# Patient Record
Sex: Female | Born: 1935 | Race: White | Hispanic: No | Smoking: Never smoker
Health system: Southern US, Community
[De-identification: ages and names within clinical notes are randomized; demographics above are authoritative.]

## PROBLEM LIST (undated history)

## (undated) DIAGNOSIS — G629 Polyneuropathy, unspecified: Secondary | ICD-10-CM

## (undated) DIAGNOSIS — E538 Deficiency of other specified B group vitamins: Secondary | ICD-10-CM

## (undated) DIAGNOSIS — K219 Gastro-esophageal reflux disease without esophagitis: Secondary | ICD-10-CM

## (undated) DIAGNOSIS — M7120 Synovial cyst of popliteal space [Baker], unspecified knee: Secondary | ICD-10-CM

## (undated) DIAGNOSIS — J3089 Other allergic rhinitis: Secondary | ICD-10-CM

## (undated) DIAGNOSIS — E7800 Pure hypercholesterolemia, unspecified: Secondary | ICD-10-CM

## (undated) DIAGNOSIS — I10 Essential (primary) hypertension: Secondary | ICD-10-CM

## (undated) DIAGNOSIS — M1990 Unspecified osteoarthritis, unspecified site: Secondary | ICD-10-CM

## (undated) DIAGNOSIS — E785 Hyperlipidemia, unspecified: Secondary | ICD-10-CM

## (undated) DIAGNOSIS — F419 Anxiety disorder, unspecified: Secondary | ICD-10-CM

## (undated) HISTORY — PX: HERNIA REPAIR: SHX51

## (undated) HISTORY — PX: CHOLECYSTECTOMY: SHX55

## (undated) HISTORY — PX: CATARACT EXTRACTION W/ INTRAOCULAR LENS IMPLANT: SHX1309

## (undated) HISTORY — PX: ABDOMINAL HYSTERECTOMY: SHX81

---

## 2004-07-13 ENCOUNTER — Ambulatory Visit: Payer: Self-pay | Admitting: Internal Medicine

## 2005-07-21 ENCOUNTER — Ambulatory Visit: Payer: Self-pay | Admitting: Internal Medicine

## 2006-07-27 ENCOUNTER — Ambulatory Visit: Payer: Self-pay | Admitting: Unknown Physician Specialty

## 2006-09-19 ENCOUNTER — Ambulatory Visit: Payer: Self-pay | Admitting: Unknown Physician Specialty

## 2007-10-26 ENCOUNTER — Ambulatory Visit: Payer: Self-pay | Admitting: Internal Medicine

## 2008-01-23 ENCOUNTER — Ambulatory Visit: Payer: Self-pay | Admitting: Internal Medicine

## 2008-11-20 ENCOUNTER — Ambulatory Visit: Payer: Self-pay | Admitting: Ophthalmology

## 2008-11-26 ENCOUNTER — Ambulatory Visit: Payer: Self-pay | Admitting: Ophthalmology

## 2009-01-27 ENCOUNTER — Ambulatory Visit: Payer: Self-pay | Admitting: Internal Medicine

## 2009-11-04 ENCOUNTER — Ambulatory Visit: Payer: Self-pay | Admitting: Unknown Physician Specialty

## 2009-11-26 ENCOUNTER — Ambulatory Visit: Payer: Self-pay | Admitting: Ophthalmology

## 2009-12-02 ENCOUNTER — Ambulatory Visit: Payer: Self-pay | Admitting: Ophthalmology

## 2010-02-10 ENCOUNTER — Ambulatory Visit: Payer: Self-pay | Admitting: Internal Medicine

## 2011-03-22 ENCOUNTER — Ambulatory Visit: Payer: Self-pay | Admitting: Internal Medicine

## 2011-09-07 ENCOUNTER — Ambulatory Visit: Payer: Self-pay | Admitting: Internal Medicine

## 2012-04-24 ENCOUNTER — Ambulatory Visit: Payer: Self-pay | Admitting: Physician Assistant

## 2012-11-14 ENCOUNTER — Ambulatory Visit: Payer: Self-pay | Admitting: Internal Medicine

## 2013-07-21 ENCOUNTER — Ambulatory Visit: Payer: Self-pay | Admitting: Neurology

## 2013-08-28 DIAGNOSIS — D126 Benign neoplasm of colon, unspecified: Secondary | ICD-10-CM | POA: Insufficient documentation

## 2013-08-28 DIAGNOSIS — E538 Deficiency of other specified B group vitamins: Secondary | ICD-10-CM | POA: Insufficient documentation

## 2013-08-31 ENCOUNTER — Ambulatory Visit: Payer: Self-pay | Admitting: Physician Assistant

## 2013-12-26 ENCOUNTER — Ambulatory Visit: Payer: Self-pay | Admitting: Podiatry

## 2014-01-16 ENCOUNTER — Ambulatory Visit: Payer: Self-pay | Admitting: Podiatry

## 2014-01-30 ENCOUNTER — Ambulatory Visit: Payer: Self-pay | Admitting: Podiatry

## 2014-02-04 ENCOUNTER — Ambulatory Visit (INDEPENDENT_AMBULATORY_CARE_PROVIDER_SITE_OTHER): Payer: Medicare Other | Admitting: Podiatry

## 2014-02-04 ENCOUNTER — Ambulatory Visit (INDEPENDENT_AMBULATORY_CARE_PROVIDER_SITE_OTHER): Payer: Medicare Other

## 2014-02-04 ENCOUNTER — Encounter: Payer: Self-pay | Admitting: Podiatry

## 2014-02-04 VITALS — BP 117/66 | HR 68 | Resp 16 | Ht 63.0 in | Wt 175.0 lb

## 2014-02-04 DIAGNOSIS — M2040 Other hammer toe(s) (acquired), unspecified foot: Secondary | ICD-10-CM

## 2014-02-04 DIAGNOSIS — M79609 Pain in unspecified limb: Secondary | ICD-10-CM

## 2014-02-04 DIAGNOSIS — B351 Tinea unguium: Secondary | ICD-10-CM

## 2014-02-04 NOTE — Progress Notes (Signed)
   Subjective:    Patient ID: Sarah Moon, female    DOB: 06-05-1926, 80 y.o.   MRN: [id number]  HPI Comments: i need my toenails trimmed, callus trimmed and look at my hammer toes. The rt foot is worse with the h toes. Both of the feet hurt in my toes. They have been hurting for years. My feet are getting worse. Dr cline has been trimming my nails and did nothing for the toes.   Foot Pain Associated symptoms include abdominal pain, fatigue and numbness.      Review of Systems  Constitutional: Positive for fatigue.  HENT: Positive for sneezing.        Ringing in ears  Gastrointestinal: Positive for abdominal pain and constipation.  Endocrine:       Increase urination  Genitourinary: Positive for frequency.  Musculoskeletal:       Joint pain Back pain Muscle pain   Neurological: Positive for dizziness, light-headedness and numbness.  Psychiatric/Behavioral: The patient is nervous/anxious.   All other systems reviewed and are negative.      Objective:   Physical Exam: I have reviewed her past medical history medications allergies surgeries social history and review of systems. Pulses are strongly palpable bilateral. Neurologic sensorium is intact per since once the monofilament. He can reflexes are intact bilateral muscle strength is 5 over 5 dorsiflexors plantar flexors inverters everters all intrinsic musculature is intact. Orthopedic evaluation demonstrates hammertoe deformities bilateral. Mild hallux valgus deformities bilateral. Cutaneous evaluation demonstrates supple well hydrated cutis with exception of thick yellow dystrophic onychomycotic painful nails bilateral.        Assessment & Plan:  Assessment: Pain in limb secondary to onychomycosis 1 through 5 bilateral. Hammertoes to toes 234 and 5 bilateral.  Plan: Debridement nails 1 through 5 bilateral. Cover service secondary to pain

## 2014-02-05 ENCOUNTER — Ambulatory Visit: Payer: Self-pay | Admitting: Physician Assistant

## 2014-05-13 ENCOUNTER — Ambulatory Visit: Payer: Medicare Other | Admitting: Podiatry

## 2014-05-15 ENCOUNTER — Ambulatory Visit (INDEPENDENT_AMBULATORY_CARE_PROVIDER_SITE_OTHER): Payer: Medicare Other | Admitting: Podiatry

## 2014-05-15 DIAGNOSIS — B351 Tinea unguium: Secondary | ICD-10-CM

## 2014-05-15 DIAGNOSIS — M79676 Pain in unspecified toe(s): Secondary | ICD-10-CM

## 2014-05-15 NOTE — Progress Notes (Signed)
Presents today chief complaint of painful elongated toenails.  Objective: Pulses are palpable bilateral nails are thick, yellow dystrophic onychomycosis and painful palpation.   Assessment: Onychomycosis with pain in limb.  Plan: Treatment of nails in thickness and length as covered service secondary to pain.  

## 2014-08-26 ENCOUNTER — Ambulatory Visit (INDEPENDENT_AMBULATORY_CARE_PROVIDER_SITE_OTHER): Payer: Medicare Other | Admitting: Podiatry

## 2014-08-26 ENCOUNTER — Other Ambulatory Visit: Payer: Medicare Other

## 2014-08-26 DIAGNOSIS — M79676 Pain in unspecified toe(s): Secondary | ICD-10-CM

## 2014-08-26 DIAGNOSIS — B351 Tinea unguium: Secondary | ICD-10-CM | POA: Diagnosis not present

## 2014-08-26 NOTE — Progress Notes (Signed)
Presents today chief complaint of painful elongated toenails.  Objective: Pulses are palpable bilateral nails are thick, yellow dystrophic onychomycosis and painful palpation.   Assessment: Onychomycosis with pain in limb.  Plan: Treatment of nails in thickness and length as covered service secondary to pain.  

## 2014-09-02 ENCOUNTER — Ambulatory Visit: Admit: 2014-09-02 | Disposition: A | Discharge status: Home / Self Care | Payer: Self-pay | Admitting: Internal Medicine

## 2014-09-02 ENCOUNTER — Ambulatory Visit: Payer: Self-pay

## 2014-10-28 ENCOUNTER — Ambulatory Visit (INDEPENDENT_AMBULATORY_CARE_PROVIDER_SITE_OTHER): Payer: Medicare Other | Admitting: Podiatry

## 2014-10-28 DIAGNOSIS — B351 Tinea unguium: Secondary | ICD-10-CM

## 2014-10-28 DIAGNOSIS — M79676 Pain in unspecified toe(s): Secondary | ICD-10-CM

## 2014-10-28 NOTE — Progress Notes (Signed)
Presents today chief complaint of painful elongated toenails.  Objective: Pulses are palpable bilateral nails are thick, yellow dystrophic onychomycosis and painful palpation.   Assessment: Onychomycosis with pain in limb.  Plan: Treatment of nails in thickness and length as covered service secondary to pain.           Reduce plantar lateral  Callus R hallux.

## 2015-01-29 ENCOUNTER — Ambulatory Visit: Payer: Medicare Other | Admitting: Podiatry

## 2015-02-03 ENCOUNTER — Ambulatory Visit (INDEPENDENT_AMBULATORY_CARE_PROVIDER_SITE_OTHER): Payer: Medicare Other | Admitting: Podiatry

## 2015-02-03 DIAGNOSIS — B351 Tinea unguium: Secondary | ICD-10-CM | POA: Diagnosis not present

## 2015-02-03 DIAGNOSIS — M79676 Pain in unspecified toe(s): Secondary | ICD-10-CM | POA: Diagnosis not present

## 2015-02-03 NOTE — Progress Notes (Signed)
Presents today chief complaint of painful elongated toenails. And dry cracked skin.  Objective: Pulses are palpable bilateral nails are thick, yellow dystrophic onychomycosis and painful palpation. Xerosis plantar aspect of the bilateral foot without seizures.  Assessment: Onychomycosis  1-5 B with pain in limb. Dry skin plantar aspect of the foot no open wounds.  Plan: Treatment of nails 1-5   in thickness and length as covered service secondary to pain. 3 mo. recommended over-the-counter use are increasing.  Title high DPM

## 2015-05-05 ENCOUNTER — Ambulatory Visit: Payer: Medicare Other

## 2015-05-13 ENCOUNTER — Ambulatory Visit (INDEPENDENT_AMBULATORY_CARE_PROVIDER_SITE_OTHER): Payer: Medicare Other | Admitting: Sports Medicine

## 2015-05-13 ENCOUNTER — Encounter: Payer: Self-pay | Admitting: Sports Medicine

## 2015-05-13 DIAGNOSIS — L84 Corns and callosities: Secondary | ICD-10-CM | POA: Diagnosis not present

## 2015-05-13 DIAGNOSIS — M21619 Bunion of unspecified foot: Secondary | ICD-10-CM

## 2015-05-13 DIAGNOSIS — G588 Other specified mononeuropathies: Secondary | ICD-10-CM

## 2015-05-13 DIAGNOSIS — M2040 Other hammer toe(s) (acquired), unspecified foot: Secondary | ICD-10-CM

## 2015-05-13 DIAGNOSIS — M79676 Pain in unspecified toe(s): Secondary | ICD-10-CM

## 2015-05-13 DIAGNOSIS — B351 Tinea unguium: Secondary | ICD-10-CM | POA: Diagnosis not present

## 2015-05-13 NOTE — Progress Notes (Signed)
Patient ID: Sarah Moon, female   DOB: 02/17/1926, 81 y.o.   MRN: [id number] Subjective: Sarah Moon is a 81 y.o. female patient seen today in office with complaint of painful thickened and elongated toenails and callus skin; unable to trim. Patient denies history of Diabetes or Vascular disease. 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 Patient has no other pedal complaints at this time.   There are no active problems to display for this patient.  Current Outpatient Prescriptions on File Prior to Visit  Medication Sig Dispense Refill  . ALPRAZolam (XANAX) 0.25 MG tablet TAKE 1 TABLET BY MOUTH EVERY DAY AS NEEDED    . brimonidine-timolol (COMBIGAN) 0.2-0.5 % ophthalmic solution     . Calcium Carbonate-Vitamin D 600-400 MG-UNIT per tablet Take by mouth.    . diclofenac (VOLTAREN) 50 MG EC tablet Take by mouth.    . escitalopram (LEXAPRO) 20 MG tablet Take by mouth.    . pravastatin (PRAVACHOL) 10 MG tablet Take by mouth.    . quinapril-hydrochlorothiazide (ACCURETIC) 20-12.5 MG per tablet Take by mouth.     No current facility-administered medications on file prior to visit.   Allergies  Allergen Reactions  . Augmentin [Amoxicillin-Pot Clavulanate] Nausea Only and Rash    GI upset    Objective: Physical Exam  General: Well developed, nourished, no acute distress, awake, alert and oriented x 3  Vascular: Dorsalis pedis artery 1/4 bilateral, Posterior tibial artery 1/4 bilateral, skin temperature warm to warm proximal to distal bilateral lower extremities, + varicosities, decreased pedal hair present bilateral.  Neurological: Gross sensation present via light touch bilateral. Protective sensation decreased at the level of the digits bilateral. Vibratory sensation diminished bilateral.    Dermatological: Skin is warm, dry, and supple  bilateral, Nails 1-10 are tender, long, thick, and discolored with mild subungal debris, no webspace macerations present bilateral, no open lesions present bilateral, + hyperkeratotic tissue present bilateral medial 1st MTPJ and right plantar hallux with No signs of infection bilateral.  Musculoskeletal: Bunion and hammertoes deformities noted bilateral with the right 2nd toe most contracted. Muscular strength within normal limits without pain or limitation on range of motion. No pain with calf compression bilateral.  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  -Discussed treatment options for painful mycotic nails, callus, bunion and hammertoes. -Mechanically debrided and reduced mycotic nails with sterile nail nipper and dremel nail file without incident. -Debrided callus/hyperkeratotic tissue x 3 using sterile chisel blade without incident -Discussed surgical correction for bunion and hammertoes; patient reports that she would like to wait till after the new year and have surgery as discussed with Dr. Al CorpusHyatt for 2nd hammertoe on right foot. -Advised good supportive shoes daily for foot type and daily foot inspection.  -Patient to return in 3  months for follow up evaluation or sooner if symptoms worsen.  Landis Martins, DPM

## 2015-05-16 ENCOUNTER — Ambulatory Visit: Payer: Medicare Other | Admitting: Sports Medicine

## 2015-08-12 ENCOUNTER — Ambulatory Visit: Payer: Medicare Other | Admitting: Sports Medicine

## 2015-08-15 ENCOUNTER — Encounter: Payer: Self-pay | Admitting: Sports Medicine

## 2015-08-15 ENCOUNTER — Ambulatory Visit: Payer: Medicare Other | Admitting: Sports Medicine

## 2015-08-15 ENCOUNTER — Ambulatory Visit (INDEPENDENT_AMBULATORY_CARE_PROVIDER_SITE_OTHER): Payer: Medicare Other | Admitting: Sports Medicine

## 2015-08-15 DIAGNOSIS — M2040 Other hammer toe(s) (acquired), unspecified foot: Secondary | ICD-10-CM

## 2015-08-15 DIAGNOSIS — L84 Corns and callosities: Secondary | ICD-10-CM

## 2015-08-15 DIAGNOSIS — B353 Tinea pedis: Secondary | ICD-10-CM

## 2015-08-15 DIAGNOSIS — G588 Other specified mononeuropathies: Secondary | ICD-10-CM

## 2015-08-15 DIAGNOSIS — M79676 Pain in unspecified toe(s): Secondary | ICD-10-CM | POA: Diagnosis not present

## 2015-08-15 DIAGNOSIS — B351 Tinea unguium: Secondary | ICD-10-CM

## 2015-08-15 DIAGNOSIS — M21619 Bunion of unspecified foot: Secondary | ICD-10-CM | POA: Diagnosis not present

## 2015-08-15 MED ORDER — CLOTRIMAZOLE 1 % EX SOLN: 1.0000 "application " | Freq: Two times a day (BID) | CUTANEOUS | Status: DC

## 2015-08-15 NOTE — Progress Notes (Signed)
Patient ID: Sarah Moon, female   DOB: 06/20/1925, 82 y.o.   MRN: [id number]  Subjective: Sarah Moon is a 82 y.o. female patient seen today in office with complaint of painful thickened and elongated toenails and callus skin; unable to trim. Patient denies history of Diabetes or Vascular disease. Admits to neuropathy unchanged from prior in both feet. Patient has no other pedal complaints at this time.   There are no active problems to display for this patient.  Current Outpatient Prescriptions on File Prior to Visit  Medication Sig Dispense Refill  . ALPRAZolam (XANAX) 0.25 MG tablet TAKE 1 TABLET BY MOUTH EVERY DAY AS NEEDED    . brimonidine-timolol (COMBIGAN) 0.2-0.5 % ophthalmic solution     . Calcium Carbonate-Vitamin D 600-400 MG-UNIT per tablet Take by mouth.    . diclofenac (VOLTAREN) 50 MG EC tablet Take by mouth.    . escitalopram (LEXAPRO) 20 MG tablet Take by mouth.    . pravastatin (PRAVACHOL) 10 MG tablet Take by mouth.    . quinapril-hydrochlorothiazide (ACCURETIC) 20-12.5 MG per tablet Take by mouth.     No current facility-administered medications on file prior to visit.   Allergies  Allergen Reactions  . Augmentin [Amoxicillin-Pot Clavulanate] Nausea Only, Rash and Other (See Comments)    GI upset GI upset    Objective: Physical Exam  General: Well developed, nourished, no acute distress, awake, alert and oriented x 3  Vascular: Dorsalis pedis artery 1/4 bilateral, Posterior tibial artery 1/4 bilateral, skin temperature warm to warm proximal to distal bilateral lower extremities, + varicosities, decreased pedal hair present bilateral.  Neurological: Gross sensation present via light touch bilateral. Protective sensation decreased at the level of the digits bilateral. Vibratory sensation diminished bilateral.    Dermatological: Skin is warm, dry, and supple bilateral, Nails 1-10 are tender, long, thick, and discolored with mild subungal debris, no webspace  macerations present bilateral, no open lesions present bilateral, + hyperkeratotic tissue present bilateral medial 1st MTPJ and right plantar hallux. + right 4th webspace maceration consistent with tinea, No signs of infection bilateral.  Musculoskeletal: Bunion and hammertoes deformities noted bilateral with the right 2nd toe most contracted. Muscular strength within normal limits without pain or limitation on range of motion. No pain with calf compression bilateral.  Assessment and Plan:  Problem List Items Addressed This Visit    None    Visit Diagnoses    Dermatophytosis of nail    -  Primary    Relevant Medications    clotrimazole (LOTRIMIN) 1 % external solution    Pain of toe, unspecified laterality        Callus of foot        Other mononeuropathy        Bunion        Hammertoe, unspecified laterality        Tinea pedis of right foot        Relevant Medications    clotrimazole (LOTRIMIN) 1 % external solution      -Examined patient.  -Discussed treatment options for painful mycotic nails, callus, tinea pedis. -Mechanically debrided and reduced mycotic nails with sterile nail nipper and dremel nail file without incident. -Debrided callus/hyperkeratotic tissue using sterile chisel blade without incident -Rx Clotrimazole solution for right 4th webspace tinea and to dry well in between toes. -Advised good supportive shoes daily for foot type and daily foot inspection.  -Patient to return in 3 months for follow up evaluation or sooner if symptoms worsen.  Landis Martins, DPM

## 2015-08-28 ENCOUNTER — Emergency Department
Admission: EM | Admit: 2015-08-28 | Discharge: 2015-08-28 | Disposition: A | Discharge status: Home / Self Care | Payer: Medicare Other | Attending: Emergency Medicine | Admitting: Emergency Medicine

## 2015-08-28 ENCOUNTER — Emergency Department: Payer: Medicare Other

## 2015-08-28 ENCOUNTER — Encounter: Payer: Self-pay | Admitting: Emergency Medicine

## 2015-08-28 DIAGNOSIS — S20212A Contusion of left front wall of thorax, initial encounter: Secondary | ICD-10-CM | POA: Diagnosis not present

## 2015-08-28 DIAGNOSIS — S5292XA Unspecified fracture of left forearm, initial encounter for closed fracture: Secondary | ICD-10-CM

## 2015-08-28 DIAGNOSIS — W1839XA Other fall on same level, initial encounter: Secondary | ICD-10-CM | POA: Insufficient documentation

## 2015-08-28 DIAGNOSIS — S52592A Other fractures of lower end of left radius, initial encounter for closed fracture: Secondary | ICD-10-CM | POA: Diagnosis not present

## 2015-08-28 DIAGNOSIS — Y9289 Other specified places as the place of occurrence of the external cause: Secondary | ICD-10-CM | POA: Diagnosis not present

## 2015-08-28 DIAGNOSIS — S6992XA Unspecified injury of left wrist, hand and finger(s), initial encounter: Secondary | ICD-10-CM | POA: Diagnosis present

## 2015-08-28 DIAGNOSIS — S4992XA Unspecified injury of left shoulder and upper arm, initial encounter: Secondary | ICD-10-CM | POA: Diagnosis not present

## 2015-08-28 DIAGNOSIS — Z79899 Other long term (current) drug therapy: Secondary | ICD-10-CM | POA: Diagnosis not present

## 2015-08-28 DIAGNOSIS — Y9389 Activity, other specified: Secondary | ICD-10-CM | POA: Insufficient documentation

## 2015-08-28 DIAGNOSIS — I10 Essential (primary) hypertension: Secondary | ICD-10-CM | POA: Diagnosis not present

## 2015-08-28 DIAGNOSIS — Y998 Other external cause status: Secondary | ICD-10-CM | POA: Diagnosis not present

## 2015-08-28 DIAGNOSIS — T148XXA Other injury of unspecified body region, initial encounter: Secondary | ICD-10-CM

## 2015-08-28 DIAGNOSIS — T148 Other injury of unspecified body region: Secondary | ICD-10-CM | POA: Insufficient documentation

## 2015-08-28 DIAGNOSIS — S52512A Displaced fracture of left radial styloid process, initial encounter for closed fracture: Secondary | ICD-10-CM | POA: Insufficient documentation

## 2015-08-28 HISTORY — DX: Essential (primary) hypertension: I10

## 2015-08-28 MED ORDER — OXYCODONE-ACETAMINOPHEN 5-325 MG PO TABS
1.0000 | ORAL_TABLET | ORAL | Status: DC | PRN
Start: 2015-08-28 — End: 2016-10-11

## 2015-08-28 MED ORDER — OXYCODONE-ACETAMINOPHEN 5-325 MG PO TABS
1.0000 | ORAL_TABLET | Freq: Once | ORAL | Status: AC
  Administered 2015-08-28: 1 via ORAL

## 2015-08-28 MED ORDER — OXYCODONE-ACETAMINOPHEN 5-325 MG PO TABS
ORAL_TABLET | ORAL | Status: AC
  Administered 2015-08-28: 1 via ORAL
  Filled 2015-08-28: qty 1

## 2015-08-28 MED ORDER — OXYCODONE-ACETAMINOPHEN 5-325 MG PO TABS: 1.0000 | ORAL_TABLET | Freq: Four times a day (QID) | ORAL | Status: DC | PRN

## 2015-08-28 NOTE — ED Notes (Signed)
Larey SeatFell this morning while pushing trash bin to curb.  Fell onto pavement and onto chest.  Patient c/o chest pain, left wrist, and left shoulder pain. Denies LOC.

## 2015-08-28 NOTE — ED Provider Notes (Signed)
[facility] Emergency Department Provider Note    ____________________________________________  Time seen: ~1805  I have reviewed the triage vital signs and the nursing notes.   HISTORY  Chief Complaint Fall   History limited by: Not Limited   HPI Sarah Moon is a 82 y.o. female who presents to the emergency department today after a fall. The patient states she was taking the trash can down the driveway when she fell. She thinks she landed straight on her chest. She is not complaining of pain of her left wrist, left shoulder and left ribs. She did not black out. She states she is not having any difficulty with breathing. She is not on any blood thinners.     Past Medical History  Diagnosis Date  . Hypertension     There are no active problems to display for this patient.   History reviewed. No pertinent past surgical history.  Current Outpatient Rx  Name  Route  Sig  Dispense  Refill  . ALPRAZolam (XANAX) 0.25 MG tablet      TAKE 1 TABLET BY MOUTH EVERY DAY AS NEEDED         . brimonidine-timolol (COMBIGAN) 0.2-0.5 % ophthalmic solution               . Calcium Carbonate-Vitamin D 600-400 MG-UNIT per tablet   Oral   Take by mouth.         . clotrimazole (LOTRIMIN) 1 % external solution   Topical   Apply 1 application topically 2 (two) times daily. In between toes   30 mL   0   . diclofenac (VOLTAREN) 50 MG EC tablet   Oral   Take by mouth.         . EXPIRED: escitalopram (LEXAPRO) 20 MG tablet   Oral   Take by mouth.         . pravastatin (PRAVACHOL) 10 MG tablet   Oral   Take by mouth.         . quinapril-hydrochlorothiazide (ACCURETIC) 20-12.5 MG per tablet   Oral   Take by mouth.           Allergies Augmentin  No family history on file.  Social History Social History  Substance Use Topics  . Smoking status: Never Smoker   . Smokeless tobacco: None  . Alcohol Use: No    Review of  Systems  Constitutional: Negative for fever. Cardiovascular: Negative for chest pain. Respiratory: Negative for shortness of breath. Gastrointestinal: Negative for abdominal pain, vomiting and diarrhea. Neurological: Negative for headaches, focal weakness or numbness.   10-point ROS otherwise negative.  ____________________________________________   PHYSICAL EXAM:  VITAL SIGNS: ED Triage Vitals  Enc Vitals Group     BP 08/28/15 1541 133/52 mmHg     Pulse Rate 08/28/15 1541 76     Resp 08/28/15 1541 20     Temp 08/28/15 1541 98 F (36.7 C)     Temp Source 08/28/15 1541 Oral     SpO2 08/28/15 1541 97 %     Weight 08/28/15 1541 175 lb (79.379 kg)     Height 08/28/15 1541  (1.6 m)     Head Cir --      Peak Flow --      Pain Score 08/28/15 1541 8   Constitutional: Alert and oriented. Well appearing and in no distress. Eyes: Conjunctivae are normal. PERRL. Normal extraocular movements. ENT   Head: Normocephalic and atraumatic.   Nose: No  congestion/rhinnorhea.   Mouth/Throat: Mucous membranes are moist.   Neck: No stridor. Hematological/Lymphatic/Immunilogical: No cervical lymphadenopathy. Cardiovascular: Normal rate, regular rhythm.  No murmurs, rubs, or gallops. Respiratory: Normal respiratory effort without tachypnea nor retractions. Breath sounds are clear and equal bilaterally. No wheezes/rales/rhonchi. Gastrointestinal: Soft and nontender. No distention. There is no CVA tenderness. Genitourinary: Deferred Musculoskeletal: left wrist swelling, tenderness. NV intact. Left shoulder with some decreased ROM secondary to pain.  Neurologic:  Normal speech and language. No gross focal neurologic deficits are appreciated.  Skin:  Skin is warm, dry and intact. No rash noted. Psychiatric: Mood and affect are normal. Speech and behavior are normal. Patient exhibits appropriate insight and judgment.  ____________________________________________    LABS  (pertinent positives/negatives)  None  ____________________________________________   EKG  I, Phineas SemenGraydon Barbie Croston, attending physician, personally viewed and interpreted this EKG  EKG Time: 1547 Rate: 76 Rhythm: normal sinus rhythm Axis: normal Intervals: qtc 454 QRS: narrow ST changes: no st elevation Impression: normal ekg   ____________________________________________    RADIOLOGY  Left wrist IMPRESSION: Comminuted and mildly impacted fracture of the distal radial metaphysis, with a volarly displaced radial styloid fragment, and mild shift of the carpal rows volarly adjacent to the defect.  CXR IMPRESSION: No active cardiopulmonary disease.  Shoulder, Left IMPRESSION: Negative.   I, Eliyanah Elgersma, personally viewed and evaluated these images (plain radiographs of wrist) as part of my medical decision making. ____________________________________________   PROCEDURES  Procedure(s) performed: None  Critical Care performed: No  ____________________________________________   INITIAL IMPRESSION / ASSESSMENT AND PLAN / ED COURSE  Pertinent labs & imaging results that were available during my care of the patient were reviewed by me and considered in my medical decision making (see chart for details).  Patient presented to the emergency department today because of concern for injuries after fall. X-rays do show a distal left radial fracture. This was splinted. Post splint check showed neurovascularly intact in fingers. Will have patient follow-up with orthopedics.  ____________________________________________   FINAL CLINICAL IMPRESSION(S) / ED DIAGNOSES  Final diagnoses:  Radius fracture, left, closed, initial encounter  Rib contusion, left, initial encounter  Abrasion     Phineas SemenGraydon Kebin Maye, MD 08/28/15 2145

## 2015-08-28 NOTE — Discharge Instructions (Signed)
Please seek medical attention for any high fevers, chest pain, shortness of breath, change in behavior, persistent vomiting, bloody stool or any other new or concerning symptoms.   Rib Contusion A rib contusion is a deep bruise on your rib area. Contusions are the result of a blunt trauma that causes bleeding and injury to the tissues under the skin. A rib contusion may involve bruising of the ribs and of the skin and muscles in the area. The skin overlying the contusion may turn blue, purple, or yellow. Minor injuries will give you a painless contusion, but more severe contusions may stay painful and swollen for a few weeks. CAUSES  A contusion is usually caused by a blow, trauma, or direct force to an area of the body. This often occurs while playing contact sports. SYMPTOMS  Swelling and redness of the injured area.  Discoloration of the injured area.  Tenderness and soreness of the injured area.  Pain with or without movement. DIAGNOSIS  The diagnosis can be made by taking a medical history and performing a physical exam. An X-ray, CT scan, or MRI may be needed to determine if there were any associated injuries, such as broken bones (fractures) or internal injuries. TREATMENT  Often, the best treatment for a rib contusion is rest. Icing or applying cold compresses to the injured area may help reduce swelling and inflammation. Deep breathing exercises may be recommended to reduce the risk of partial lung collapse and pneumonia. Over-the-counter or prescription medicines may also be recommended for pain control. HOME CARE INSTRUCTIONS   Apply ice to the injured area:  Put ice in a plastic bag.  Place a towel between your skin and the bag.  Leave the ice on for 20 minutes, 2-3 times per day.  Take medicines only as directed by your health care provider.  Rest the injured area. Avoid strenuous activity and any activities or movements that cause pain. Be careful during activities and  avoid bumping the injured area.  Perform deep-breathing exercises as directed by your health care provider.  Do not lift anything that is heavier than 5 lb (2.3 kg) until your health care provider approves.  Do not use any tobacco products, including cigarettes, chewing tobacco, or electronic cigarettes. If you need help quitting, ask your health care provider. SEEK MEDICAL CARE IF:   You have increased bruising or swelling.  You have pain that is not controlled with treatment.  You have a fever. SEEK IMMEDIATE MEDICAL CARE IF:   You have difficulty breathing or shortness of breath.  You develop a continual cough, or you cough up thick or bloody sputum.  You feel sick to your stomach (nauseous), you throw up (vomit), or you have abdominal pain.   This information is not intended to replace advice given to you by your health care provider. Make sure you discuss any questions you have with your health care provider.   Document Released: 02/23/2001 Document Revised: 06/21/2014 Document Reviewed: 03/12/2014 Elsevier Interactive Patient Education 2016 [employer] Radial Fracture A radial fracture is a break in the radius bone, which is the long bone of the forearm that is on the same side as your thumb. Your forearm is the part of your arm that is between your elbow and your wrist. It is made up of two bones: the radius and the ulna. Most radial fractures occur near the wrist (distal radialfracture) or near the elbow (radial head fracture). A distal radial fracture is the most common type of  broken arm. This fracture usually occurs about an inch above the wrist. Fractures of the middle part of the bone are less common. CAUSES  Falling with your arm outstretched is the most common cause of a radial fracture. Other causes include:  Car accidents.  Bike accidents.  A direct blow to the middle part of the radius. RISK FACTORS  You may be at greater risk for a distal radial fracture  if you are 80 years of age or older.  You may be at greater risk for a radial head fracture if you are:  Female.  1930-80 years old.  You may be at a greater risk for all types of radial fractures if you have a condition that causes your bones to be weak or thin (osteoporosis). SIGNS AND SYMPTOMS A radial fracture causes pain immediately after the injury. Other signs and symptoms include:  An abnormal bend or bump in your arm (deformity).  Swelling.  Bruising.  Numbness or tingling.  Tenderness.  Limited movement. DIAGNOSIS  Your health care provider may diagnose a radial fracture based on:  Your symptoms.  Your medical history, including any recent injury.  A physical exam. Your health care provider will look for any deformity and feel for tenderness over the break. Your health care provider will also check whether the bone is out of place.  An X-ray exam to confirm the diagnosis and learn more about the type of fracture. TREATMENT The goals of treatment are to get the bone in proper position for healing and to keep it from moving so it will heal over time. Your treatment will depend on many factors, especially the type of fracture that you have.  If the fractured bone:  Is in the correct position (nondisplaced), you may only need to wear a cast or a splint.  Has a slightly displaced fracture, you may need to have the bones moved back into place manually (closed reduction) before the splint or cast is put on.  You may have a temporary splint before you have a plaster cast. The splint allows room for some swelling. After a few days, a cast can replace the splint.  You may have to wear the cast for about 6 weeks or as directed by your health care provider.  The cast may be changed after about 3 weeks or as directed by your health care provider.  After your cast is taken off, you may need physical therapy to regain full movement in your wrist or elbow.  You may need  emergency surgery if you have:  A fractured bone that is out of position (displaced).  A fracture with multiple fragments (comminuted fracture).  A fracture that breaks the skin (open fracture). This type of fracture may require surgical wires, plates, or screws to hold the bone in place.  You may have X-rays every couple of weeks to check on your healing. HOME CARE INSTRUCTIONS  Keep the injured arm above the level of your heart while you are sitting or lying down. This helps to reduce swelling and pain.  Apply ice to the injured area:  Put ice in a plastic bag.  Place a towel between your skin and the bag.  Leave the ice on for 20 minutes, 2-3 times per day.  Move your fingers often to avoid stiffness and to minimize swelling.  If you have a plaster or fiberglass cast:  Do not try to scratch the skin under the cast using sharp or pointed objects.  Check  the skin around the cast every day. You may put lotion on any red or sore areas.  Keep your cast dry and clean.  If you have a plaster splint:  Wear the splint as directed.  Loosen the elastic around the splint if your fingers become numb and tingle, or if they turn cold and blue.  Do not put pressure on any part of your cast until it is fully hardened. Rest your cast only on a pillow for the first 24 hours.  Protect your cast or splint while bathing or showering, as directed by your health care provider. Do not put your cast or splint into water.  Take medicines only as directed by your health care provider.  Return to activities, such as sports, as directed by your health care provider. Ask your health care provider what activities are safe for you.  Keep all follow-up visits as directed by your health care provider. This is important. SEEK MEDICAL CARE IF:  Your pain medicine is not helping.  Your cast gets damaged or it breaks.  Your cast becomes loose.  Your cast gets wet.  You have more severe pain or  swelling than you did before the cast.  You have severe pain when stretching your fingers.  You continue to have pain or stiffness in your elbow or your wrist after your cast is taken off. SEEK IMMEDIATE MEDICAL CARE IF:  You cannot move your fingers.  You lose feeling in your fingers or your hand.  Your hand or your fingers turn cold and pale or blue.  You notice a bad smell coming from your cast.  You have drainage from underneath your cast.  You have new stains from blood or drainage seeping through your cast.   This information is not intended to replace advice given to you by your health care provider. Make sure you discuss any questions you have with your health care provider.   Document Released: 11/11/2005 Document Revised: 06/21/2014 Document Reviewed: 11/23/2013 Elsevier Interactive Patient Education [employer]

## 2015-08-28 NOTE — ED Notes (Signed)
Pt states she fell earlier today pulling in the trashcan. Pt states she landed on her chest and L side. Pt denies LOC, dizziness, or weakness, but states that she is a "little unsteady on her feet". Pt c/o pain to L wrist, shoulder, L side of chest, and L knee.

## 2015-11-21 ENCOUNTER — Ambulatory Visit: Payer: Medicare Other | Admitting: Sports Medicine

## 2016-10-03 ENCOUNTER — Encounter: Payer: Self-pay | Admitting: Emergency Medicine

## 2016-10-03 ENCOUNTER — Emergency Department: Payer: Medicare Other

## 2016-10-03 ENCOUNTER — Emergency Department
Admission: EM | Admit: 2016-10-03 | Discharge: 2016-10-03 | Disposition: A | Discharge status: Home / Self Care | Payer: Medicare Other | Attending: Emergency Medicine | Admitting: Emergency Medicine

## 2016-10-03 DIAGNOSIS — W1809XA Striking against other object with subsequent fall, initial encounter: Secondary | ICD-10-CM | POA: Diagnosis not present

## 2016-10-03 DIAGNOSIS — I10 Essential (primary) hypertension: Secondary | ICD-10-CM | POA: Diagnosis not present

## 2016-10-03 DIAGNOSIS — S0003XA Contusion of scalp, initial encounter: Secondary | ICD-10-CM

## 2016-10-03 DIAGNOSIS — Y999 Unspecified external cause status: Secondary | ICD-10-CM | POA: Diagnosis not present

## 2016-10-03 DIAGNOSIS — W19XXXA Unspecified fall, initial encounter: Secondary | ICD-10-CM

## 2016-10-03 DIAGNOSIS — S0990XA Unspecified injury of head, initial encounter: Secondary | ICD-10-CM | POA: Diagnosis present

## 2016-10-03 DIAGNOSIS — Y9389 Activity, other specified: Secondary | ICD-10-CM | POA: Diagnosis not present

## 2016-10-03 DIAGNOSIS — Y929 Unspecified place or not applicable: Secondary | ICD-10-CM | POA: Diagnosis not present

## 2016-10-03 HISTORY — DX: Pure hypercholesterolemia, unspecified: E78.00

## 2016-10-03 NOTE — Discharge Instructions (Signed)
Return to Emergency Department if symptoms worsens otherwise follow up with PCP as needed.

## 2016-10-03 NOTE — ED Triage Notes (Signed)
Pt presents to ED via POV c/o fall with head involvement. Pt states she turned to talk to her son and lost her balance while out on her deck, hit back of head on wall, hematoma noted. No bleeding noted to scalp. Very small abrasion to R elbow, no bleeding noted. Pt denies use of blood thinners, denies dizziness or LOC, denies increased weakness. Scheduled for hip surgery may 16th and uses cane. Unsteady on feet at baseline.

## 2016-10-03 NOTE — ED Provider Notes (Signed)
[facility] Emergency Department Provider Note   ____________________________________________   I have reviewed the triage vital signs and the nursing notes.   HISTORY  Chief Complaint Fall    HPI Sarah Moon is a 83 y.o. female presents s/p fall with hematoma to R occipital scalp region.Pt reports that she lost her balance whiling during a pivot turn. She struck the back of her head on a deck rail. No bleeding noted to scalp. Note small abrasion to R elbow, no bleeding noted. Pt denies use of blood thinners, denies dizziness or LOC. Pt son present described her "appearing stunned" for about 15 minutes following the fall. Pt hip surgery may 16th and ambulates with a  cane. No past head injury reported.    Past Medical History:  Diagnosis Date  . High cholesterol   . Hypertension     There are no active problems to display for this patient.   Past Surgical History:  Procedure Laterality Date  . CHOLECYSTECTOMY      Prior to Admission medications   Medication Sig Start Date End Date Taking? Authorizing Provider  ALPRAZolam (XANAX) 0.25 MG tablet TAKE 1 TABLET BY MOUTH EVERY DAY AS NEEDED 02/01/14   Historical Provider, MD  brimonidine-timolol (COMBIGAN) 0.2-0.5 % ophthalmic solution  09/15/13   Historical Provider, MD  Calcium Carbonate-Vitamin D 600-400 MG-UNIT per tablet Take by mouth.    Historical Provider, MD  clotrimazole (LOTRIMIN) 1 % external solution Apply 1 application topically 2 (two) times daily. In between toes 08/15/15   Asencion Islam, DPM  diclofenac (VOLTAREN) 50 MG EC tablet Take by mouth. 11/20/13   Historical Provider, MD  escitalopram (LEXAPRO) 20 MG tablet Take by mouth. 11/20/13 02/18/14  Historical Provider, MD  oxyCODONE-acetaminophen (ROXICET) 5-325 MG tablet Take 1 tablet by mouth every 4 (four) hours as needed for severe pain. 08/28/15   Phineas Semen, MD  oxyCODONE-acetaminophen (ROXICET) 5-325 MG tablet Take 1 tablet by  mouth every 6 (six) hours as needed for severe pain. 08/28/15   Phineas Semen, MD  pravastatin (PRAVACHOL) 10 MG tablet Take by mouth.    Historical Provider, MD  quinapril-hydrochlorothiazide (ACCURETIC) 20-12.5 MG per tablet Take by mouth.    Historical Provider, MD    Allergies Augmentin [amoxicillin-pot clavulanate]  No family history on file.  Social History Social History  Substance Use Topics  . Smoking status: Never Smoker  . Smokeless tobacco: Never Used  . Alcohol use No    Review of Systems Constitutional: No fever/chills Eyes: No visual changes. Head: hematoma to posterior scalp Cardiovascular: Denies chest pain. Respiratory: Denies cough Gastrointestinal: No nausea, no vomiting.   Musculoskeletal: Negative for back pain. Positive for muscle soreness along cervical region. Skin: Negative for rash. Neurological: Mild headache.  ____________________________________________   PHYSICAL EXAM:  VITAL SIGNS: ED Triage Vitals  Enc Vitals Group     BP 10/03/16 1520 106/65     Pulse Rate 10/03/16 1520 61     Resp 10/03/16 1520 16     Temp 10/03/16 1520 98.2 F (36.8 C)     Temp Source 10/03/16 1520 Oral     SpO2 10/03/16 1520 96 %     Weight 10/03/16 1521 170 lb (77.1 kg)     Height 10/03/16 1521  (1.6 m)     Head Circumference --      Peak Flow --      Pain Score 10/03/16 1522 8     Pain Loc --  Pain Edu? --      Excl. in GC? --     Constitutional: Alert and oriented. Well appearing and in no acute distress. Head: Hematoma R lower occipital region  Cardiovascular: Normal rate, regular rhythm.  Good peripheral circulation. Respiratory: Normal respiratory effort.  Genitourinary: deferred Musculoskeletal: No lower extremity tenderness nor edema.  No joint effusions. Neurologic:  Normal speech and language. No gross focal neurologic deficits are appreciated. CN I-XII intact. Paraspinal soreness along cervical region. Skin:  Skin is warm, dry and  intact. No rash noted. Psychiatric: Mood and affect are normal. Speech and behavior are normal.  ____________________________________________   LABS (all labs ordered are listed, but only abnormal results are displayed)  Labs Reviewed - No data to display ____________________________________________  EKG none  ____________________________________________  RADIOLOGY CT head scan unremarkable ____________________________________________   PROCEDURES  Procedure(s) performed: no    Critical Care performed: no ____________________________________________   INITIAL IMPRESSION / ASSESSMENT AND PLAN / ED COURSE  Pertinent labs & imaging results that were available during my care of the patient were reviewed by me and considered in my medical decision making (see chart for details).  Patient sign/symptoms consistent with hematoma along posterior scalp secondary to fall. CT scan imaging unremarkable. Pt remained alert and oriented, no focal neuro changes noted during course of ED stay. Pt will be discharged home with instructions for head injury, contusion/hematoma. Pt and family instructed to return to Emergency Department if symptoms worsened and follow up with PCP as needed.       ____________________________________________   FINAL CLINICAL IMPRESSION(S) / ED DIAGNOSES  Final diagnoses:  Fall, initial encounter  Contusion of scalp, initial encounter      NEW MEDICATIONS STARTED DURING THIS VISIT:  Discharge Medication List as of 10/03/2016  4:38 PM       Note:  This document was prepared using Dragon voice recognition software and may include unintentional dictation errors.    Jordan Likes Little, PA-C 10/03/16 1655    Emily Filbert, MD 10/03/16 [phone]

## 2016-10-03 NOTE — ED Notes (Signed)
FIRST NURSE NOTE: Pt fell today when she lost her balance, hit the right side of her head, No LOC, pt is alert and oriented x 4 at this time. Here with son. Was seen at [facility] first but cannot rule out any injury.

## 2016-10-13 ENCOUNTER — Encounter
Admission: RE | Admit: 2016-10-13 | Discharge: 2016-10-13 | Disposition: A | Discharge status: Home / Self Care | Payer: Medicare Other | Source: Ambulatory Visit | Attending: Orthopedic Surgery | Admitting: Orthopedic Surgery

## 2016-10-13 DIAGNOSIS — Z01810 Encounter for preprocedural cardiovascular examination: Secondary | ICD-10-CM | POA: Diagnosis present

## 2016-10-13 DIAGNOSIS — I10 Essential (primary) hypertension: Secondary | ICD-10-CM | POA: Diagnosis not present

## 2016-10-13 DIAGNOSIS — Z01812 Encounter for preprocedural laboratory examination: Secondary | ICD-10-CM | POA: Diagnosis present

## 2016-10-13 HISTORY — DX: Deficiency of other specified B group vitamins: E53.8

## 2016-10-13 HISTORY — DX: Synovial cyst of popliteal space (Baker), unspecified knee: M71.20

## 2016-10-13 HISTORY — DX: Anxiety disorder, unspecified: F41.9

## 2016-10-13 HISTORY — DX: Hyperlipidemia, unspecified: E78.5

## 2016-10-13 HISTORY — DX: Other allergic rhinitis: J30.89

## 2016-10-13 HISTORY — DX: Gastro-esophageal reflux disease without esophagitis: K21.9

## 2016-10-13 HISTORY — DX: Polyneuropathy, unspecified: G62.9

## 2016-10-13 HISTORY — DX: Unspecified osteoarthritis, unspecified site: M19.90

## 2016-10-13 LAB — COMPREHENSIVE METABOLIC PANEL
ALK PHOS: 64 U/L (ref 38–126)
ALT: 11 U/L — AB (ref 14–54)
ANION GAP: 7 (ref 5–15)
AST: 18 U/L (ref 15–41)
Albumin: 4.2 g/dL (ref 3.5–5.0)
BUN: 30 mg/dL — ABNORMAL HIGH (ref 6–20)
CALCIUM: 9.4 mg/dL (ref 8.9–10.3)
CO2: 28 mmol/L (ref 22–32)
CREATININE: 1.23 mg/dL — AB (ref 0.44–1.00)
Chloride: 106 mmol/L (ref 101–111)
GFR, EST AFRICAN AMERICAN: 44 mL/min — AB (ref >60)
GFR, EST NON AFRICAN AMERICAN: 38 mL/min — AB (ref >60)
Glucose, Bld: 93 mg/dL (ref 65–99)
Potassium: 4.3 mmol/L (ref 3.5–5.1)
SODIUM: 141 mmol/L (ref 135–145)
Total Bilirubin: 0.8 mg/dL (ref 0.3–1.2)
Total Protein: 7.5 g/dL (ref 6.5–8.1)

## 2016-10-13 LAB — URINALYSIS, ROUTINE W REFLEX MICROSCOPIC
BILIRUBIN URINE: NEGATIVE
Glucose, UA: NEGATIVE mg/dL
HGB URINE DIPSTICK: NEGATIVE
Ketones, ur: 5 mg/dL — AB
LEUKOCYTES UA: NEGATIVE
Nitrite: NEGATIVE
PH: 5 (ref 5.0–8.0)
Protein, ur: 30 mg/dL — AB
Specific Gravity, Urine: 1.024 (ref 1.005–1.030)

## 2016-10-13 LAB — SURGICAL PCR SCREEN
MRSA, PCR: NEGATIVE
Staphylococcus aureus: NEGATIVE

## 2016-10-13 LAB — PROTIME-INR
INR: 1.05
PROTHROMBIN TIME: 13.7 s (ref 11.4–15.2)

## 2016-10-13 LAB — APTT: aPTT: 42 seconds — ABNORMAL HIGH (ref 24–36)

## 2016-10-13 LAB — CBC WITH DIFFERENTIAL/PLATELET
Basophils Absolute: 0.1 10*3/uL (ref 0–0.1)
Basophils Relative: 1 %
EOS ABS: 0.2 10*3/uL (ref 0–0.7)
EOS PCT: 2 %
HCT: 38.9 % (ref 35.0–47.0)
Hemoglobin: 13 g/dL (ref 12.0–16.0)
LYMPHS ABS: 3.3 10*3/uL (ref 1.0–3.6)
LYMPHS PCT: 37 %
MCH: 31.2 pg (ref 26.0–34.0)
MCHC: 33.5 g/dL (ref 32.0–36.0)
MCV: 93.2 fL (ref 80.0–100.0)
MONOS PCT: 7 %
Monocytes Absolute: 0.6 10*3/uL (ref 0.2–0.9)
Neutro Abs: 4.8 10*3/uL (ref 1.4–6.5)
Neutrophils Relative %: 53 %
PLATELETS: 276 10*3/uL (ref 150–440)
RBC: 4.17 MIL/uL (ref 3.80–5.20)
RDW: 14.2 % (ref 11.5–14.5)
WBC: 9 10*3/uL (ref 3.6–11.0)

## 2016-10-13 LAB — TYPE AND SCREEN
ABO/RH(D): O POS
Antibody Screen: NEGATIVE

## 2016-10-13 LAB — C-REACTIVE PROTEIN: CRP: <0.8 mg/dL (ref <1.0)

## 2016-10-13 LAB — SEDIMENTATION RATE: SED RATE: 27 mm/h (ref 0–30)

## 2016-10-13 NOTE — Patient Instructions (Signed)
Your procedure is scheduled on: 10/27/16 Wed Report to Same Day Surgery 2nd floor medical mall [facility] Entrance-take elevator on left to 2nd floor.  Check in with surgery information desk.) To find out your arrival time please call [phone] between 1PM - 3PM on 10/26/16 Tues  Remember: Instructions that are not followed completely may result in serious medical risk, up to and including death, or upon the discretion of your surgeon and anesthesiologist your surgery may need to be rescheduled.    _x___ 1. Do not eat food or drink liquids after midnight. No gum chewing or                              hard candies.     __x__ 2. No Alcohol for 24 hours before or after surgery.   __x__3. No Smoking for 24 prior to surgery.   ____  4. Bring all medications with you on the day of surgery if instructed.    __x__ 5. Notify your doctor if there is any change in your medical condition     (cold, fever, infections).     Do not wear jewelry, make-up, hairpins, clips or nail polish.  Do not wear lotions, powders, or perfumes. You may wear deodorant.  Do not shave 48 hours prior to surgery. Men may shave face and neck.  Do not bring valuables to the hospital.    [facility] is not responsible for any belongings or valuables.               Contacts, dentures or bridgework may not be worn into surgery.  Leave your suitcase in the car. After surgery it may be brought to your room.  For patients admitted to the hospital, discharge time is determined by your                       treatment team.   Patients discharged the day of surgery will not be allowed to drive home.  You will need someone to drive you home and stay with you the night of your procedure.    Please read over the following fact sheets that you were given:   [facility] Preparing for Surgery and or MRSA Information   _x___ Take anti-hypertensive (unless it includes a diuretic), cardiac, seizure, asthma,     anti-reflux and  psychiatric medicines. These include:  1. ALPRAZolam (XANAX  2.citalopram (CELEXA  3.ranitidine (ZANTAC)  The night before and the morning of surgery  4.  5.  6.  ____Fleets enema or Magnesium Citrate as directed.   _x___ Use CHG Soap or sage wipes as directed on instruction sheet   ____ Use inhalers on the day of surgery and bring to hospital day of surgery  ____ Stop Metformin and Janumet 2 days prior to surgery.    ____ Take 1/2 of usual insulin dose the night before surgery and none on the morning     surgery.   _x___ Follow recommendations from Cardiologist, Pulmonologist or PCP regarding          stopping Aspirin, Coumadin, Pllavix ,Eliquis, Effient, or Pradaxa, and Pletal.  X____Stop Anti-inflammatories such as Advil, Aleve, Ibuprofen, Motrin, Naproxen, Naprosyn, Goodies powders or aspirin products. OK to take Tylenol and                          Celebrex.   _x___ Stop  supplements until after surgery.  But may continue Vitamin D, Vitamin B,       and multivitamin.   ____ Bring C-Pap to the hospital.

## 2016-10-15 ENCOUNTER — Other Ambulatory Visit
Admission: RE | Admit: 2016-10-15 | Discharge: 2016-10-15 | Disposition: A | Discharge status: Home / Self Care | Payer: Medicare Other | Source: Ambulatory Visit | Attending: Orthopedic Surgery | Admitting: Orthopedic Surgery

## 2016-10-15 DIAGNOSIS — Z01812 Encounter for preprocedural laboratory examination: Secondary | ICD-10-CM | POA: Diagnosis present

## 2016-10-16 LAB — URINE CULTURE: CULTURE: NO GROWTH

## 2016-10-26 MED ORDER — TRANEXAMIC ACID 1000 MG/10ML IV SOLN
1000.0000 mg | INTRAVENOUS | Status: AC
  Administered 2016-10-27: 1000 mg via INTRAVENOUS
  Filled 2016-10-26: qty 10

## 2016-10-26 MED ORDER — CLINDAMYCIN PHOSPHATE 900 MG/50ML IV SOLN
900.0000 mg | INTRAVENOUS | Status: AC
  Administered 2016-10-27: 900 mg via INTRAVENOUS

## 2016-10-27 ENCOUNTER — Inpatient Hospital Stay
Admission: RE | Admit: 2016-10-27 | Discharge: 2016-10-29 | DRG: 470 | Disposition: A | Discharge status: Home / Self Care | Payer: Medicare Other | Source: Ambulatory Visit | Attending: Orthopedic Surgery | Admitting: Orthopedic Surgery

## 2016-10-27 ENCOUNTER — Inpatient Hospital Stay: Payer: Medicare Other

## 2016-10-27 ENCOUNTER — Inpatient Hospital Stay: Payer: Medicare Other | Admitting: Certified Registered Nurse Anesthetist

## 2016-10-27 ENCOUNTER — Encounter
Admission: RE | Disposition: A | Discharge status: Home / Self Care | Payer: Self-pay | Source: Ambulatory Visit | Attending: Orthopedic Surgery

## 2016-10-27 ENCOUNTER — Encounter: Payer: Self-pay | Admitting: Orthopedic Surgery

## 2016-10-27 DIAGNOSIS — M1612 Unilateral primary osteoarthritis, left hip: Secondary | ICD-10-CM | POA: Diagnosis present

## 2016-10-27 DIAGNOSIS — I10 Essential (primary) hypertension: Secondary | ICD-10-CM | POA: Diagnosis present

## 2016-10-27 DIAGNOSIS — K219 Gastro-esophageal reflux disease without esophagitis: Secondary | ICD-10-CM | POA: Diagnosis present

## 2016-10-27 DIAGNOSIS — M7121 Synovial cyst of popliteal space [Baker], right knee: Secondary | ICD-10-CM | POA: Diagnosis present

## 2016-10-27 DIAGNOSIS — F419 Anxiety disorder, unspecified: Secondary | ICD-10-CM | POA: Diagnosis present

## 2016-10-27 DIAGNOSIS — Z96649 Presence of unspecified artificial hip joint: Secondary | ICD-10-CM

## 2016-10-27 DIAGNOSIS — G629 Polyneuropathy, unspecified: Secondary | ICD-10-CM | POA: Diagnosis present

## 2016-10-27 DIAGNOSIS — E7800 Pure hypercholesterolemia, unspecified: Secondary | ICD-10-CM | POA: Diagnosis present

## 2016-10-27 DIAGNOSIS — J301 Allergic rhinitis due to pollen: Secondary | ICD-10-CM | POA: Diagnosis present

## 2016-10-27 DIAGNOSIS — Z79899 Other long term (current) drug therapy: Secondary | ICD-10-CM

## 2016-10-27 HISTORY — DX: Presence of unspecified artificial hip joint: Z96.649

## 2016-10-27 HISTORY — PX: TOTAL HIP ARTHROPLASTY: SHX124

## 2016-10-27 LAB — ABO/RH: ABO/RH(D): O POS

## 2016-10-27 SURGERY — ARTHROPLASTY, HIP, TOTAL,POSTERIOR APPROACH
Anesthesia: Spinal | Laterality: Left | Wound class: Clean

## 2016-10-27 MED ORDER — PROPOFOL 10 MG/ML IV BOLUS
INTRAVENOUS | Status: AC
  Filled 2016-10-27: qty 20

## 2016-10-27 MED ORDER — PROMETHAZINE HCL 25 MG/ML IJ SOLN: 6.2500 mg | INTRAMUSCULAR | Status: DC | PRN

## 2016-10-27 MED ORDER — BUPIVACAINE HCL (PF) 0.5 % IJ SOLN
INTRAMUSCULAR | Status: DC | PRN
  Administered 2016-10-27: 2 mL

## 2016-10-27 MED ORDER — TRANEXAMIC ACID 1000 MG/10ML IV SOLN
1000.0000 mg | Freq: Once | INTRAVENOUS | Status: AC
  Administered 2016-10-27: 1000 mg via INTRAVENOUS
  Filled 2016-10-27: qty 10

## 2016-10-27 MED ORDER — MORPHINE SULFATE (PF) 2 MG/ML IV SOLN
2.0000 mg | INTRAVENOUS | Status: DC | PRN
  Administered 2016-10-27: 2 mg via INTRAVENOUS
  Filled 2016-10-27: qty 1

## 2016-10-27 MED ORDER — ENOXAPARIN SODIUM 30 MG/0.3ML ~~LOC~~ SOLN
30.0000 mg | Freq: Two times a day (BID) | SUBCUTANEOUS | Status: DC
  Administered 2016-10-28 – 2016-10-29 (×3): 30 mg via SUBCUTANEOUS
  Filled 2016-10-27 (×3): qty 0.3

## 2016-10-27 MED ORDER — FENTANYL CITRATE (PF) 100 MCG/2ML IJ SOLN
INTRAMUSCULAR | Status: AC
  Administered 2016-10-27: 25 ug via INTRAVENOUS
  Filled 2016-10-27: qty 2

## 2016-10-27 MED ORDER — OXYCODONE HCL 5 MG PO TABS: 5.0000 mg | ORAL_TABLET | Freq: Once | ORAL | Status: DC | PRN

## 2016-10-27 MED ORDER — BISACODYL 10 MG RE SUPP
10.0000 mg | Freq: Every day | RECTAL | Status: DC | PRN
  Administered 2016-10-29: 10 mg via RECTAL
  Filled 2016-10-27: qty 1

## 2016-10-27 MED ORDER — FENTANYL CITRATE (PF) 100 MCG/2ML IJ SOLN
25.0000 ug | INTRAMUSCULAR | Status: AC | PRN
  Administered 2016-10-27 (×6): 25 ug via INTRAVENOUS

## 2016-10-27 MED ORDER — ONDANSETRON HCL 4 MG PO TABS: 4.0000 mg | ORAL_TABLET | Freq: Four times a day (QID) | ORAL | Status: DC | PRN

## 2016-10-27 MED ORDER — TETRACAINE HCL 1 % IJ SOLN
INTRAMUSCULAR | Status: AC
  Filled 2016-10-27: qty 2

## 2016-10-27 MED ORDER — FENTANYL CITRATE (PF) 100 MCG/2ML IJ SOLN
INTRAMUSCULAR | Status: DC | PRN
  Administered 2016-10-27: 100 ug via INTRAVENOUS

## 2016-10-27 MED ORDER — ACETAMINOPHEN 650 MG RE SUPP: 650.0000 mg | Freq: Four times a day (QID) | RECTAL | Status: DC | PRN

## 2016-10-27 MED ORDER — SODIUM CHLORIDE 0.9 % IV SOLN
INTRAVENOUS | Status: DC | PRN
  Administered 2016-10-27: 20 ug/min via INTRAVENOUS
  Administered 2016-10-27: 30 ug/min via INTRAVENOUS

## 2016-10-27 MED ORDER — ONDANSETRON HCL 4 MG/2ML IJ SOLN
INTRAMUSCULAR | Status: AC
  Administered 2016-10-27: 4 mg
  Filled 2016-10-27: qty 2

## 2016-10-27 MED ORDER — FENTANYL CITRATE (PF) 100 MCG/2ML IJ SOLN
INTRAMUSCULAR | Status: AC
  Filled 2016-10-27: qty 2

## 2016-10-27 MED ORDER — LIDOCAINE HCL (PF) 2 % IJ SOLN
INTRAMUSCULAR | Status: AC
  Filled 2016-10-27: qty 2

## 2016-10-27 MED ORDER — QUINAPRIL HCL 10 MG PO TABS
20.0000 mg | ORAL_TABLET | Freq: Every day | ORAL | Status: DC
  Administered 2016-10-29: 20 mg via ORAL
  Filled 2016-10-27: qty 2

## 2016-10-27 MED ORDER — SODIUM CHLORIDE 0.9 % IJ SOLN
INTRAMUSCULAR | Status: AC
  Filled 2016-10-27: qty 50

## 2016-10-27 MED ORDER — CYANOCOBALAMIN 1000 MCG/ML IJ SOLN
1000.0000 ug | INTRAMUSCULAR | Status: DC
  Filled 2016-10-27: qty 1

## 2016-10-27 MED ORDER — POLYVINYL ALCOHOL 1.4 % OP SOLN
1.0000 [drp] | Freq: Three times a day (TID) | OPHTHALMIC | Status: DC | PRN
  Filled 2016-10-27: qty 15

## 2016-10-27 MED ORDER — OXYCODONE HCL 5 MG PO TABS
5.0000 mg | ORAL_TABLET | ORAL | Status: DC | PRN
  Administered 2016-10-27: 5 mg via ORAL
  Administered 2016-10-27 – 2016-10-28 (×3): 10 mg via ORAL
  Filled 2016-10-27 (×2): qty 2
  Filled 2016-10-27: qty 1
  Filled 2016-10-27: qty 2

## 2016-10-27 MED ORDER — ACETAMINOPHEN 10 MG/ML IV SOLN
INTRAVENOUS | Status: AC
Start: 2016-10-27 — End: 2016-10-27
  Filled 2016-10-27: qty 100

## 2016-10-27 MED ORDER — TETRACAINE HCL 1 % IJ SOLN
INTRAMUSCULAR | Status: DC | PRN
Start: 2016-10-27 — End: 2016-10-27
  Administered 2016-10-27: 10 mg via INTRASPINAL

## 2016-10-27 MED ORDER — DEXAMETHASONE SODIUM PHOSPHATE 4 MG/ML IJ SOLN
INTRAMUSCULAR | Status: DC | PRN
  Administered 2016-10-27: 5 mg via INTRAVENOUS

## 2016-10-27 MED ORDER — ACETAMINOPHEN 325 MG PO TABS
650.0000 mg | ORAL_TABLET | Freq: Four times a day (QID) | ORAL | Status: DC | PRN
  Administered 2016-10-28: 650 mg via ORAL
  Filled 2016-10-27: qty 2

## 2016-10-27 MED ORDER — BRIMONIDINE TARTRATE-TIMOLOL 0.2-0.5 % OP SOLN: 1.0000 [drp] | Freq: Every morning | OPHTHALMIC | Status: DC

## 2016-10-27 MED ORDER — CALCIUM CARBONATE 1500 (600 CA) MG PO TABS
600.0000 mg | ORAL_TABLET | Freq: Two times a day (BID) | ORAL | Status: DC
  Filled 2016-10-27: qty 1

## 2016-10-27 MED ORDER — QUINAPRIL-HYDROCHLOROTHIAZIDE 20-12.5 MG PO TABS: 1.0000 | ORAL_TABLET | Freq: Every day | ORAL | Status: DC

## 2016-10-27 MED ORDER — PROPOFOL 10 MG/ML IV BOLUS
INTRAVENOUS | Status: DC | PRN
  Administered 2016-10-27: 20 mg via INTRAVENOUS
  Administered 2016-10-27: 17 mg via INTRAVENOUS

## 2016-10-27 MED ORDER — CHLORHEXIDINE GLUCONATE 4 % EX LIQD: 60.0000 mL | Freq: Once | CUTANEOUS | Status: DC

## 2016-10-27 MED ORDER — METOCLOPRAMIDE HCL 10 MG PO TABS
10.0000 mg | ORAL_TABLET | Freq: Three times a day (TID) | ORAL | Status: AC
  Administered 2016-10-27 – 2016-10-29 (×6): 10 mg via ORAL
  Filled 2016-10-27 (×6): qty 1

## 2016-10-27 MED ORDER — ACETAMINOPHEN 10 MG/ML IV SOLN
INTRAVENOUS | Status: DC | PRN
  Administered 2016-10-27: 1000 mg via INTRAVENOUS

## 2016-10-27 MED ORDER — OXYBUTYNIN CHLORIDE ER 10 MG PO TB24
10.0000 mg | ORAL_TABLET | Freq: Every day | ORAL | Status: DC
  Administered 2016-10-27 – 2016-10-28 (×2): 10 mg via ORAL
  Filled 2016-10-27 (×2): qty 1

## 2016-10-27 MED ORDER — BUPIVACAINE LIPOSOME 1.3 % IJ SUSP
INTRAMUSCULAR | Status: AC
Start: 2016-10-27 — End: 2016-10-27
  Filled 2016-10-27: qty 20

## 2016-10-27 MED ORDER — CLINDAMYCIN PHOSPHATE 600 MG/50ML IV SOLN
600.0000 mg | Freq: Four times a day (QID) | INTRAVENOUS | Status: AC
  Administered 2016-10-27 – 2016-10-28 (×4): 600 mg via INTRAVENOUS
  Filled 2016-10-27 (×4): qty 50

## 2016-10-27 MED ORDER — ALPRAZOLAM 0.25 MG PO TABS: 0.2500 mg | ORAL_TABLET | Freq: Two times a day (BID) | ORAL | Status: DC | PRN

## 2016-10-27 MED ORDER — MENTHOL 3 MG MT LOZG
1.0000 | LOZENGE | OROMUCOSAL | Status: DC | PRN
  Filled 2016-10-27: qty 9

## 2016-10-27 MED ORDER — HYDROCHLOROTHIAZIDE 12.5 MG PO CAPS
12.5000 mg | ORAL_CAPSULE | Freq: Every day | ORAL | Status: DC
  Administered 2016-10-29: 12.5 mg via ORAL
  Filled 2016-10-27: qty 1

## 2016-10-27 MED ORDER — PROPOFOL 500 MG/50ML IV EMUL
INTRAVENOUS | Status: DC | PRN
  Administered 2016-10-27: 40 ug/kg/min via INTRAVENOUS

## 2016-10-27 MED ORDER — ALUM & MAG HYDROXIDE-SIMETH 200-200-20 MG/5ML PO SUSP: 30.0000 mL | ORAL | Status: DC | PRN

## 2016-10-27 MED ORDER — LATANOPROST 0.005 % OP SOLN
1.0000 [drp] | Freq: Every day | OPHTHALMIC | Status: DC
  Filled 2016-10-27: qty 2.5

## 2016-10-27 MED ORDER — CALCIUM CARBONATE ANTACID 500 MG PO CHEW
3.0000 | CHEWABLE_TABLET | Freq: Two times a day (BID) | ORAL | Status: DC
  Administered 2016-10-28 – 2016-10-29 (×2): 600 mg via ORAL
  Filled 2016-10-27 (×2): qty 3

## 2016-10-27 MED ORDER — FLEET ENEMA 7-19 GM/118ML RE ENEM: 1.0000 | ENEMA | Freq: Once | RECTAL | Status: DC | PRN

## 2016-10-27 MED ORDER — PHENOL 1.4 % MT LIQD
1.0000 | OROMUCOSAL | Status: DC | PRN
  Filled 2016-10-27: qty 177

## 2016-10-27 MED ORDER — LISINOPRIL 20 MG PO TABS: 20.0000 mg | ORAL_TABLET | Freq: Every day | ORAL | Status: DC

## 2016-10-27 MED ORDER — TIMOLOL MALEATE 0.5 % OP SOLN
1.0000 [drp] | Freq: Every day | OPHTHALMIC | Status: DC
  Filled 2016-10-27: qty 5

## 2016-10-27 MED ORDER — BIMATOPROST 0.01 % OP SOLN
1.0000 [drp] | Freq: Every day | OPHTHALMIC | Status: DC
  Administered 2016-10-27 – 2016-10-28 (×2): 1 [drp] via OPHTHALMIC
  Filled 2016-10-27: qty 2.5

## 2016-10-27 MED ORDER — MAGNESIUM HYDROXIDE 400 MG/5ML PO SUSP: 30.0000 mL | Freq: Every day | ORAL | Status: DC | PRN

## 2016-10-27 MED ORDER — NEOMYCIN-POLYMYXIN B GU 40-200000 IR SOLN
Status: AC
  Filled 2016-10-27: qty 20

## 2016-10-27 MED ORDER — SODIUM CHLORIDE 0.9 % IV SOLN
INTRAVENOUS | Status: DC
  Administered 2016-10-27 (×2): via INTRAVENOUS

## 2016-10-27 MED ORDER — TRAMADOL HCL 50 MG PO TABS
50.0000 mg | ORAL_TABLET | ORAL | Status: DC | PRN
  Administered 2016-10-27: 50 mg via ORAL
  Administered 2016-10-28 – 2016-10-29 (×2): 100 mg via ORAL
  Filled 2016-10-27 (×2): qty 2
  Filled 2016-10-27: qty 1

## 2016-10-27 MED ORDER — CETIRIZINE HCL 10 MG PO TABS: 10.0000 mg | ORAL_TABLET | Freq: Every evening | ORAL | Status: DC | PRN

## 2016-10-27 MED ORDER — ONDANSETRON HCL 4 MG/2ML IJ SOLN: 4.0000 mg | Freq: Four times a day (QID) | INTRAMUSCULAR | Status: DC | PRN

## 2016-10-27 MED ORDER — CLINDAMYCIN PHOSPHATE 900 MG/50ML IV SOLN
INTRAVENOUS | Status: AC
  Filled 2016-10-27: qty 50

## 2016-10-27 MED ORDER — PANTOPRAZOLE SODIUM 40 MG PO TBEC
40.0000 mg | DELAYED_RELEASE_TABLET | Freq: Two times a day (BID) | ORAL | Status: DC
  Administered 2016-10-27 – 2016-10-29 (×4): 40 mg via ORAL
  Filled 2016-10-27 (×4): qty 1

## 2016-10-27 MED ORDER — MEPERIDINE HCL 50 MG/ML IJ SOLN: 6.2500 mg | INTRAMUSCULAR | Status: DC | PRN

## 2016-10-27 MED ORDER — NEOMYCIN-POLYMYXIN B GU 40-200000 IR SOLN
Status: DC | PRN
  Administered 2016-10-27: 16 mL

## 2016-10-27 MED ORDER — MORPHINE SULFATE (PF) 2 MG/ML IV SOLN
INTRAVENOUS | Status: AC
  Filled 2016-10-27: qty 1

## 2016-10-27 MED ORDER — FERROUS SULFATE 325 (65 FE) MG PO TABS
325.0000 mg | ORAL_TABLET | Freq: Two times a day (BID) | ORAL | Status: DC
  Administered 2016-10-28 – 2016-10-29 (×3): 325 mg via ORAL
  Filled 2016-10-27 (×3): qty 1

## 2016-10-27 MED ORDER — PROPOFOL 500 MG/50ML IV EMUL
INTRAVENOUS | Status: AC
  Filled 2016-10-27: qty 50

## 2016-10-27 MED ORDER — DEXAMETHASONE SODIUM PHOSPHATE 10 MG/ML IJ SOLN
INTRAMUSCULAR | Status: AC
  Filled 2016-10-27: qty 1

## 2016-10-27 MED ORDER — PRAVASTATIN SODIUM 20 MG PO TABS
20.0000 mg | ORAL_TABLET | Freq: Every day | ORAL | Status: DC
  Administered 2016-10-27 – 2016-10-28 (×2): 20 mg via ORAL
  Filled 2016-10-27 (×2): qty 1

## 2016-10-27 MED ORDER — FLUTICASONE PROPIONATE 50 MCG/ACT NA SUSP
1.0000 | Freq: Every day | NASAL | Status: DC | PRN
Start: 2016-10-27 — End: 2016-10-29
  Filled 2016-10-27: qty 16

## 2016-10-27 MED ORDER — LACTATED RINGERS IV SOLN
INTRAVENOUS | Status: DC
  Administered 2016-10-27 (×2): via INTRAVENOUS

## 2016-10-27 MED ORDER — HYDROCHLOROTHIAZIDE 12.5 MG PO CAPS: 12.5000 mg | ORAL_CAPSULE | Freq: Every day | ORAL | Status: DC

## 2016-10-27 MED ORDER — DIPHENHYDRAMINE HCL 12.5 MG/5ML PO ELIX: 12.5000 mg | ORAL_SOLUTION | ORAL | Status: DC | PRN

## 2016-10-27 MED ORDER — OXYCODONE HCL 5 MG/5ML PO SOLN: 5.0000 mg | Freq: Once | ORAL | Status: DC | PRN

## 2016-10-27 MED ORDER — CITALOPRAM HYDROBROMIDE 20 MG PO TABS
20.0000 mg | ORAL_TABLET | Freq: Every day | ORAL | Status: DC
  Administered 2016-10-27 – 2016-10-29 (×3): 20 mg via ORAL
  Filled 2016-10-27 (×3): qty 1

## 2016-10-27 MED ORDER — BUPIVACAINE HCL (PF) 0.25 % IJ SOLN
INTRAMUSCULAR | Status: AC
  Filled 2016-10-27: qty 60

## 2016-10-27 MED ORDER — SENNOSIDES-DOCUSATE SODIUM 8.6-50 MG PO TABS
1.0000 | ORAL_TABLET | Freq: Two times a day (BID) | ORAL | Status: DC
  Administered 2016-10-27 – 2016-10-29 (×4): 1 via ORAL
  Filled 2016-10-27 (×4): qty 1

## 2016-10-27 MED ORDER — ACETAMINOPHEN 10 MG/ML IV SOLN
1000.0000 mg | Freq: Four times a day (QID) | INTRAVENOUS | Status: AC
  Administered 2016-10-27 – 2016-10-28 (×4): 1000 mg via INTRAVENOUS
  Filled 2016-10-27 (×4): qty 100

## 2016-10-27 SURGICAL SUPPLY — 51 items
BLADE DRUM FLTD (BLADE) ×3 IMPLANT
BLADE SAW 1 (BLADE) ×3 IMPLANT
CANISTER SUCT 1200ML W/VALVE (MISCELLANEOUS) ×3 IMPLANT
CANISTER SUCT 3000ML PPV (MISCELLANEOUS) ×6 IMPLANT
CAPT HIP TOTAL 2 ×3 IMPLANT
CARTRIDGE OIL MAESTRO DRILL (MISCELLANEOUS) ×1 IMPLANT
CATH FOL LEG HOLDER (MISCELLANEOUS) ×3 IMPLANT
CATH TRAY METER 16FR LF (MISCELLANEOUS) ×3 IMPLANT
DIFFUSER MAESTRO (MISCELLANEOUS) ×3 IMPLANT
DRAPE INCISE IOBAN 66X60 STRL (DRAPES) ×3 IMPLANT
DRAPE SHEET LG 3/4 BI-LAMINATE (DRAPES) ×3 IMPLANT
DRSG DERMACEA 8X12 NADH (GAUZE/BANDAGES/DRESSINGS) ×3 IMPLANT
DRSG OPSITE POSTOP 4X12 (GAUZE/BANDAGES/DRESSINGS) ×3 IMPLANT
DRSG OPSITE POSTOP 4X14 (GAUZE/BANDAGES/DRESSINGS) IMPLANT
DRSG TEGADERM 4X4.75 (GAUZE/BANDAGES/DRESSINGS) ×3 IMPLANT
DURAPREP 26ML APPLICATOR (WOUND CARE) ×3 IMPLANT
ELECT BLADE 6.5 EXT (BLADE) ×3 IMPLANT
ELECT CAUTERY BLADE 6.4 (BLADE) ×3 IMPLANT
GLOVE BIOGEL M STRL SZ7.5 (GLOVE) ×6 IMPLANT
GLOVE BIOGEL PI IND STRL 9 (GLOVE) ×1 IMPLANT
GLOVE BIOGEL PI INDICATOR 9 (GLOVE) ×2
GLOVE INDICATOR 8.0 STRL GRN (GLOVE) ×3 IMPLANT
GLOVE SURG SYN 9.0  PF PI (GLOVE) ×2
GLOVE SURG SYN 9.0 PF PI (GLOVE) ×1 IMPLANT
GOWN STRL REUS W/ TWL LRG LVL3 (GOWN DISPOSABLE) ×2 IMPLANT
GOWN STRL REUS W/TWL 2XL LVL3 (GOWN DISPOSABLE) ×3 IMPLANT
GOWN STRL REUS W/TWL LRG LVL3 (GOWN DISPOSABLE) ×4
HEMOVAC 400CC 10FR (MISCELLANEOUS) ×3 IMPLANT
HOOD PEEL AWAY FLYTE STAYCOOL (MISCELLANEOUS) ×6 IMPLANT
KIT RM TURNOVER STRD PROC AR (KITS) ×3 IMPLANT
NDL SAFETY 18GX1.5 (NEEDLE) ×3 IMPLANT
NS IRRIG 500ML POUR BTL (IV SOLUTION) ×3 IMPLANT
OIL CARTRIDGE MAESTRO DRILL (MISCELLANEOUS) ×3
PACK HIP PROSTHESIS (MISCELLANEOUS) ×3 IMPLANT
PULSAVAC PLUS IRRIG FAN TIP (DISPOSABLE) ×3
SOL .9 NS 3000ML IRR  AL (IV SOLUTION) ×2
SOL .9 NS 3000ML IRR UROMATIC (IV SOLUTION) ×1 IMPLANT
SOL PREP PVP 2OZ (MISCELLANEOUS) ×3
SOLUTION PREP PVP 2OZ (MISCELLANEOUS) ×1 IMPLANT
SPONGE DRAIN TRACH 4X4 STRL 2S (GAUZE/BANDAGES/DRESSINGS) ×3 IMPLANT
STAPLER SKIN PROX 35W (STAPLE) ×3 IMPLANT
SUT ETHIBOND #5 BRAIDED 30INL (SUTURE) ×3 IMPLANT
SUT VIC AB 0 CT1 36 (SUTURE) ×3 IMPLANT
SUT VIC AB 1 CT1 36 (SUTURE) ×6 IMPLANT
SUT VIC AB 2-0 CT1 27 (SUTURE) ×2
SUT VIC AB 2-0 CT1 TAPERPNT 27 (SUTURE) ×1 IMPLANT
SYR 20CC LL (SYRINGE) ×3 IMPLANT
TAPE ADH 3 LX (MISCELLANEOUS) ×3 IMPLANT
TAPE TRANSPORE STRL 2 31045 (GAUZE/BANDAGES/DRESSINGS) ×3 IMPLANT
TIP FAN IRRIG PULSAVAC PLUS (DISPOSABLE) ×1 IMPLANT
TOWEL OR 17X26 4PK STRL BLUE (TOWEL DISPOSABLE) ×3 IMPLANT

## 2016-10-27 NOTE — H&P (Signed)
The patient has been re-examined, and the chart reviewed, and there have been no interval changes to the documented history and physical.    The risks, benefits, and alternatives have been discussed at length. The patient expressed understanding of the risks benefits and agreed with plans for surgical intervention.  Sarah Moon P. Danyela Posas, Jr. M.D.    

## 2016-10-27 NOTE — Anesthesia Preprocedure Evaluation (Signed)
Anesthesia Evaluation  Patient identified by MRN, date of birth, ID band Patient awake    Reviewed: Allergy & Precautions, NPO status , Patient's Chart, lab work & pertinent test results  History of Anesthesia Complications Negative for: history of anesthetic complications  Airway Mallampati: II  TM Distance: >3 FB Neck ROM: Full    Dental  (+) Missing, Poor Dentition   Pulmonary neg pulmonary ROS, neg sleep apnea, neg COPD,    breath sounds clear to auscultation- rhonchi (-) wheezing      Cardiovascular hypertension, Pt. on medications (-) CAD, (-) Past MI and (-) Cardiac Stents  Rhythm:Regular Rate:Normal - Systolic murmurs and - Diastolic murmurs    Neuro/Psych Anxiety negative neurological ROS     GI/Hepatic Neg liver ROS, GERD  ,  Endo/Other  negative endocrine ROSneg diabetes  Renal/GU negative Renal ROS     Musculoskeletal  (+) Arthritis ,   Abdominal (+) + obese,   Peds  Hematology negative hematology ROS (+)   Anesthesia Other Findings Past Medical History: No date: Anxiety No date: Arthritis No date: B12 deficiency No date: Baker's cyst     Comment: Rt leg No date: Elevated lipids No date: Environmental and seasonal allergies No date: GERD (gastroesophageal reflux disease) No date: High cholesterol No date: Hypertension No date: Neuropathy   Reproductive/Obstetrics                             Lab Results  Component Value Date   WBC 9.0 10/13/2016   HGB 13.0 10/13/2016   HCT 38.9 10/13/2016   MCV 93.2 10/13/2016   PLT 276 10/13/2016    Anesthesia Physical Anesthesia Plan  ASA: II  Anesthesia Plan: Spinal   Post-op Pain Management:    Induction:   Airway Management Planned: Natural Airway  Additional Equipment:   Intra-op Plan:   Post-operative Plan:   Informed Consent: I have reviewed the patients History and Physical, chart, labs and discussed the  procedure including the risks, benefits and alternatives for the proposed anesthesia with the patient or authorized representative who has indicated his/her understanding and acceptance.   Dental advisory given  Plan Discussed with: Anesthesiologist and CRNA  Anesthesia Plan Comments:         Anesthesia Quick Evaluation

## 2016-10-27 NOTE — Evaluation (Signed)
Physical Therapy Evaluation Patient Details Name: HENRYETTA CORRIVEAU MRN: [id number] DOB: 03-08-1926 Today's Date: 10/27/2016   History of Present Illness  admitted for acute hospitalization status post L THR (10/27/16), posterior approach, WBAT  Clinical Impression  Upon evaluation, patient alert and oriented; follows all commands and demonstrates good insight/safety awareness.  Rates pain in L hip 8/10, but willing to participate with session so "I can get back to doing what I want to do".  L hip strength grossly 3-/5, limited by pain; sensation fully intact.  Currently requiring mod assist for bed mobility; min assist for sit/stand, basic transfers and short-distance gait (5') with RW.  Very short, choppy steps; hesitant to bear weight through L LE, but no buckling or LOB noted. Would benefit from skilled PT to address above deficits and promote optimal return to PLOF; Recommend transition to HHPT upon discharge from acute hospitalization.     Follow Up Recommendations Home health PT    Equipment Recommendations       Recommendations for Other Services       Precautions / Restrictions Precautions Precautions: Fall;Posterior Hip Restrictions Weight Bearing Restrictions: Yes LLE Weight Bearing: Weight bearing as tolerated      Mobility  Bed Mobility Overal bed mobility: Needs Assistance Bed Mobility: Supine to Sit     Supine to sit: Mod assist        Transfers Overall transfer level: Needs assistance Equipment used: Rolling walker (2 wheeled) Transfers: Sit to/from Stand Sit to Stand: Min assist         General transfer comment: cuing for hand/foot placement with movement transition  Ambulation/Gait Ambulation/Gait assistance: Min assist Ambulation Distance (Feet): 5 Feet Assistive device: Rolling walker (2 wheeled)       General Gait Details: step to gait pattern with decreased step height, length; fair stance time and weight acceptance L LE  Stairs             Wheelchair Mobility    Modified Rankin (Stroke Patients Only)       Balance Overall balance assessment: Needs assistance Sitting-balance support: No upper extremity supported;Feet supported Sitting balance-Leahy Scale: Good     Standing balance support: Bilateral upper extremity supported Standing balance-Leahy Scale: Fair                               Pertinent Vitals/Pain Pain Assessment: 0-10 Pain Score: 8  Pain Location: L hip Pain Descriptors / Indicators: Aching Pain Intervention(s): Limited activity within patient's tolerance;Monitored during session;Premedicated before session;Repositioned    Home Living Family/patient expects to be discharged to:: Private residence Living Arrangements: Alone Available Help at Discharge: Family;Available 24 hours/day (daughter planning to move/stay with patient x2-3 months upon discharge) Type of Home: House Home Access: Stairs to enter Entrance Stairs-Rails: Doctor, general practice of Steps: 3 Home Layout: Two level;Able to live on main level with bedroom/bathroom Home Equipment: Dan Humphreys - 2 wheels;Cane - single point      Prior Function Level of Independence: Independent with assistive device(s)         Comments: Mod indep with ADLs, household and community mobility; intermittent use of SPC vs RW due to worsening L hip pain.  Does endorse 2 falls within previous year     Hand Dominance        Extremity/Trunk Assessment   Upper Extremity Assessment Upper Extremity Assessment: Overall WFL for tasks assessed    Lower Extremity Assessment Lower Extremity Assessment:  (L  hip grossly 3-/5 throughout, limited by pain.  Baseline neuropathy in bilat feet; otherwise, sensation intact and symmetrical)       Communication   Communication: HOH (R > L)  Cognition Arousal/Alertness: Awake/alert Behavior During Therapy: WFL for tasks assessed/performed Overall Cognitive Status: Within  Functional Limits for tasks assessed                                        General Comments      Exercises Other Exercises Other Exercises: Supine LE therex, 1x10, AROM for muscular strength/endurance: ankle pumps, quad sets, SAQs, heel slides and hip abduct/adduct Other Exercises: Verbally reviewed posterior THPs and functional implications; patient/family voiced understanding.  Will continue to reinforce throughout subsequent sessions   Assessment/Plan    PT Assessment Patient needs continued PT services  PT Problem List Decreased strength;Decreased range of motion;Decreased activity tolerance;Decreased balance;Decreased mobility;Decreased coordination;Decreased cognition;Decreased knowledge of use of DME;Decreased safety awareness;Decreased knowledge of precautions;Pain       PT Treatment Interventions DME instruction;Gait training;Stair training;Functional mobility training;Therapeutic activities;Therapeutic exercise;Balance training;Neuromuscular re-education;Patient/family education    PT Goals (Current goals can be found in the Care Plan section)  Acute Rehab PT Goals Patient Stated Goal: to return home with daughter PT Goal Formulation: With patient/family Time For Goal Achievement: 11/10/16 Potential to Achieve Goals: Good    Frequency BID   Barriers to discharge        Co-evaluation               AM-PAC PT "6 Clicks" Daily Activity  Outcome Measure Difficulty turning over in bed (including adjusting bedclothes, sheets and blankets)?: Total Difficulty moving from lying on back to sitting on the side of the bed? : Total Difficulty sitting down on and standing up from a chair with arms (e.g., wheelchair, bedside commode, etc,.)?: Total Help needed moving to and from a bed to chair (including a wheelchair)?: A Little Help needed walking in hospital room?: A Little Help needed climbing 3-5 steps with a railing? : A Lot 6 Click Score: 11     End of Session Equipment Utilized During Treatment: Gait belt Activity Tolerance: Patient tolerated treatment well Patient left: in chair;with call bell/phone within reach;with chair alarm set;with family/visitor present Nurse Communication: Mobility status PT Visit Diagnosis: Pain;Muscle weakness (generalized) (M62.81);Difficulty in walking, not elsewhere classified (R26.2) Pain - Right/Left: Left Pain - part of body: Hip    Time: [id number] PT Time Calculation (min) (ACUTE ONLY): 35 min   Charges:   PT Evaluation $PT Eval Low Complexity: 1 Procedure PT Treatments $Therapeutic Exercise: 8-22 mins   PT G Codes:       Laiklynn Raczynski H. Manson PasseyBrown, PT, DPT, NCS 10/27/16, 10:29 PM [phone]

## 2016-10-27 NOTE — Transfer of Care (Signed)
Immediate Anesthesia Transfer of Care Note  Patient: Sarah Moon  Procedure(s) Performed: Procedure(s): TOTAL HIP ARTHROPLASTY (Left)  Patient Location: PACU  Anesthesia Type:Spinal  Level of Consciousness: awake, alert , oriented and patient cooperative  Airway & Oxygen Therapy: Patient Spontanous Breathing and Patient connected to nasal cannula oxygen  Post-op Assessment: Report given to RN and Post -op Vital signs reviewed and stable  Post vital signs: Reviewed and stable  Last Vitals:  Vitals:   10/27/16 0611  BP: 134/67  Pulse: 71  Resp: 16  Temp: 36.3 C    Last Pain:  Vitals:   10/27/16 0611  TempSrc: Oral  PainSc: 3          Complications: No apparent anesthesia complications

## 2016-10-27 NOTE — Op Note (Signed)
OPERATIVE NOTE  DATE OF SURGERY:  10/27/2016  PATIENT NAME:  Sarah Moon   DOB: 09/27/1925  MRN: [id number]  PRE-OPERATIVE DIAGNOSIS: Degenerative arthrosis of the left hip, primary  POST-OPERATIVE DIAGNOSIS:  Same  PROCEDURE:  Left total hip arthroplasty  SURGEON:  Jena GaussJames P Abenezer Odonell, Jr. M.D.  ASSISTANT:  Van ClinesJon Wolfe, PA (present and scrubbed throughout the case, critical for assistance with exposure, retraction, instrumentation, and closure)  ANESTHESIA: spinal  ESTIMATED BLOOD LOSS: 75 mL  FLUIDS REPLACED: 1100 mL of crystalloid  DRAINS: 2 medium drains to a Hemovac reservoir  IMPLANTS UTILIZED: DePuy 13.5 mm small stature AML femoral stem, 54 mm OD Pinnacle 100 acetabular component, neutral Pinnacle Marathon polyethylene insert, and a 36 mm M-SPEC +1.5 mm hip ball  INDICATIONS FOR SURGERY: Sarah Moon is a 83 y.o. year old female with a long history of progressive hip and groin  pain. X-rays demonstrated severe degenerative changes. The patient had not seen any significant improvement despite conservative nonsurgical intervention. After discussion of the risks and benefits of surgical intervention, the patient expressed understanding of the risks benefits and agree with plans for total hip arthroplasty.   The risks, benefits, and alternatives were discussed at length including but not limited to the risks of infection, bleeding, nerve injury, stiffness, blood clots, the need for revision surgery, limb length inequality, dislocation, cardiopulmonary complications, among others, and they were willing to proceed.  PROCEDURE IN DETAIL: The patient was brought into the operating room and, after adequate spinal anesthesia was achieved, the patient was placed in a right lateral decubitus position. Axillary roll was placed and all bony prominences were well-padded. The patient's left hip was cleaned and prepped with alcohol and DuraPrep and draped in the usual sterile fashion. A  "timeout" was performed as per usual protocol. A lateral curvilinear incision was made gently curving towards the posterior superior iliac spine. The IT band was incised in line with the skin incision and the fibers of the gluteus maximus were split in line. The piriformis tendon was identified, skeletonized, and incised at its insertion to the proximal femur and reflected posteriorly. A T type posterior capsulotomy was performed. Prior to dislocation of the femoral head, a threaded Steinmann pin was inserted through a separate stab incision into the pelvis superior to the acetabulum and bent in the form of a stylus so as to assess limb length and hip offset throughout the procedure. The femoral head was then dislocated posteriorly. Inspection of the femoral head demonstrated severe degenerative changes with full-thickness loss of articular cartilage. The femoral neck cut was performed using an oscillating saw. The anterior capsule was elevated off of the femoral neck using a periosteal elevator. Attention was then directed to the acetabulum. The remnant of the labrum was excised using electrocautery. Inspection of the acetabulum also demonstrated significant degenerative changes. The acetabulum was reamed in sequential fashion up to a 53 mm diameter. Good punctate bleeding bone was encountered. A 54 mm Pinnacle 100 acetabular component was positioned and impacted into place. Good scratch fit was appreciated. A neutral polyethylene trial was inserted.  Attention was then directed to the proximal femur. A hole for reaming of the proximal femoral canal was created using a high-speed burr. The femoral canal was reamed in sequential fashion up to a 13 mm diameter. This allowed for approximately 6 cm of scratch fit. Serial broaches were inserted up to a 13.5 mm small stature femoral broach. Calcar region was planed and a trial reduction was performed  using a 36 mm hip ball with a +1.5 mm neck length. Good equalization  of limb lengths and hip offset was appreciated and excellent stability was noted both anteriorly and posteriorly. Trial components were removed. The acetabular shell was irrigated with copious amounts of normal saline with antibiotic solution and suctioned dry. A neutral Pinnacle Marathon polyethylene insert was positioned and impacted into place. Next, a 13.5 mm small stature AML femoral stem was positioned and impacted into place. Excellent scratch fit was appreciated. A trial reduction was again performed with a 36 mm hip ball with a +1.5 mm neck length. Again, good equalization of limb lengths was appreciated and excellent stability appreciated both anteriorly and posteriorly. The hip was then dislocated and the trial hip ball was removed. The Morse taper was cleaned and dried. A 36 mm M-SPEC hip ball with a +1.5 mm neck length was placed on the trunnion and impacted into place. The hip was then reduced and placed through range of motion. Excellent stability was appreciated both anteriorly and posteriorly.  The wound was irrigated with copious amounts of normal saline with antibiotic solution and suctioned dry. Good hemostasis was appreciated. The posterior capsulotomy was repaired using #5 Ethibond. Piriformis tendon was reapproximated to the undersurface of the gluteus medius tendon using #5 Ethibond. Two medium drains were placed in the wound bed and brought out through separate stab incisions to be attached to a Hemovac reservoir. The IT band was reapproximated using interrupted sutures of #1 Vicryl. Subcutaneous tissue was approximated using first #0 Vicryl followed by #2-0 Vicryl. The skin was closed with skin staples.  The patient tolerated the procedure well and was transported to the recovery room in stable condition.   Jena Gauss., M.D.

## 2016-10-27 NOTE — Anesthesia Post-op Follow-up Note (Cosign Needed)
Anesthesia QCDR form completed.        

## 2016-10-27 NOTE — Progress Notes (Signed)
Patient was admitted to room 138 from surgery. Family at bedside. Foley, Hemovac, NSLs in place from surgery. Dressing to left hip is CD&I. Family requesting bed alarm to be off while in room with patient. Notified family to let nursing know when/if they leave the room. Reviewed POC and instructed on IS. Family also requesting that pharmacy come and review all of patient's meds before they allow nursing to give meds.

## 2016-10-27 NOTE — Anesthesia Procedure Notes (Signed)
Spinal  Patient location during procedure: OR Start time: 10/27/2016 7:30 AM End time: 10/27/2016 7:38 AM Staffing Anesthesiologist: Emmie Niemann Performed: anesthesiologist  Preanesthetic Checklist Completed: patient identified, site marked, surgical consent, pre-op evaluation, timeout performed, IV checked, risks and benefits discussed and monitors and equipment checked Spinal Block Patient position: sitting Prep: ChloraPrep Patient monitoring: heart rate, continuous pulse ox, blood pressure and cardiac monitor Approach: midline Location: L4-5 Injection technique: single-shot Needle Needle type: Introducer and Pencil-Tip  Needle gauge: 24 G Needle length: 9 cm Additional Notes Negative paresthesia. Negative blood return. Positive free-flowing CSF. Expiration date of kit checked and confirmed. Patient tolerated procedure well, without complications.

## 2016-10-27 NOTE — NC FL2 (Signed)
[place] MEDICAID FL2 LEVEL OF CARE SCREENING TOOL     IDENTIFICATION  Patient Name: Sarah BeckwithMildred L Penninger Birthdate: 01/22/1926 Sex: female Admission Date (Current Location): 10/27/2016  [place]   Facility and Address:  [facility], [address], [place], [place] [address]      Provider Number: [id number]  Attending Physician Name and Address:  Donato HeinzHooten, James P, MD  Relative Name and Phone Number:       Current Level of Care: Hospital Recommended Level of Care: Skilled Nursing Facility Prior Approval Number:    Date Approved/Denied:   PASRR Number:  [id number])  Discharge Plan: SNF    Current Diagnoses: Patient Active Problem List   Diagnosis Date Noted  . Status post total replacement of hip 10/27/2016    Orientation RESPIRATION BLADDER Height & Weight     Self, Time, Situation, Place  O2 (2 Liters Oxygen ) Continent Weight: 172 lb 8 oz (78.2 kg) Height:  5\' 3"  (160 cm)  BEHAVIORAL SYMPTOMS/MOOD NEUROLOGICAL BOWEL NUTRITION STATUS   (none )  (none) Continent Diet (Diet: Clear Liquid to be advanced )  AMBULATORY STATUS COMMUNICATION OF NEEDS Skin   Extensive Assist Verbally Surgical wounds (Incision: Left Hip. )                       Personal Care Assistance Level of Assistance  Bathing, Feeding, Dressing Bathing Assistance: Limited assistance Feeding assistance: Independent Dressing Assistance: Limited assistance     Functional Limitations Info  Sight, Hearing, Speech Sight Info: Adequate Hearing Info: Adequate Speech Info: Adequate    SPECIAL CARE FACTORS FREQUENCY  PT (By licensed PT), OT (By licensed OT)     PT Frequency:  (5) OT Frequency:  (5)            Contractures      Additional Factors Info  Code Status, Allergies Code Status Info:  (Full Code. ) Allergies Info:  (Augmentin Amoxicillin-pot Clavulanate)           Current Medications (10/27/2016):  This is the current  hospital active medication list Current Facility-Administered Medications  Medication Dose Route Frequency Provider Last Rate Last Dose  . 0.9 %  sodium chloride infusion   Intravenous Continuous Hooten, Illene LabradorJames P, MD 100 mL/hr at 10/27/16 1304    . acetaminophen (OFIRMEV) IV 1,000 mg  1,000 mg Intravenous Q6H Hooten, Illene LabradorJames P, MD 400 mL/hr at 10/27/16 1301 1,000 mg at 10/27/16 1301  . [START ON 10/28/2016] acetaminophen (TYLENOL) tablet 650 mg  650 mg Oral Q6H PRN Hooten, Illene LabradorJames P, MD       Or  . Melene Muller[START ON 10/28/2016] acetaminophen (TYLENOL) suppository 650 mg  650 mg Rectal Q6H PRN Hooten, Illene LabradorJames P, MD      . ALPRAZolam Prudy Feeler(XANAX) tablet 0.25 mg  0.25 mg Oral BID PRN Hooten, Illene LabradorJames P, MD      . alum & mag hydroxide-simeth (MAALOX/MYLANTA) 200-200-20 MG/5ML suspension 30 mL  30 mL Oral Q4H PRN Hooten, Illene LabradorJames P, MD      . bisacodyl (DULCOLAX) suppository 10 mg  10 mg Rectal Daily PRN Hooten, Illene LabradorJames P, MD      . calcium carbonate (TUMS - dosed in mg elemental calcium) chewable tablet 600 mg of elemental calcium  3 tablet Oral BID WC Hooten, Illene LabradorJames P, MD      . cetirizine (ZYRTEC) tablet 10 mg  10 mg Oral QHS PRN Hooten, Illene LabradorJames P, MD      .  citalopram (CELEXA) tablet 20 mg  20 mg Oral Daily Hooten, Illene Labrador, MD      . clindamycin (CLEOCIN) IVPB 600 mg  600 mg Intravenous Q6H Hooten, Illene Labrador, MD 100 mL/hr at 10/27/16 1301 600 mg at 10/27/16 1301  . cyanocobalamin ((VITAMIN B-12)) injection 1,000 mcg  1,000 mcg Intramuscular Q30 days Hooten, Illene Labrador, MD      . diphenhydrAMINE (BENADRYL) 12.5 MG/5ML elixir 12.5-25 mg  12.5-25 mg Oral Q4H PRN Hooten, Illene Labrador, MD      . Melene Muller ON 10/28/2016] enoxaparin (LOVENOX) injection 30 mg  30 mg Subcutaneous Q12H Hooten, Illene Labrador, MD      . ferrous sulfate tablet 325 mg  325 mg Oral BID WC Hooten, Illene Labrador, MD      . fluticasone (FLONASE) 50 MCG/ACT nasal spray 1 spray  1 spray Each Nare Daily PRN Hooten, Illene Labrador, MD      . lisinopril (PRINIVIL,ZESTRIL) tablet 20 mg  20 mg Oral  Daily Hooten, Illene Labrador, MD       And  . hydrochlorothiazide (MICROZIDE) capsule 12.5 mg  12.5 mg Oral Daily Hooten, Illene Labrador, MD      . latanoprost (XALATAN) 0.005 % ophthalmic solution 1 drop  1 drop Both Eyes QHS Hooten, Illene Labrador, MD      . magnesium hydroxide (MILK OF MAGNESIA) suspension 30 mL  30 mL Oral Daily PRN Hooten, Illene Labrador, MD      . menthol-cetylpyridinium (CEPACOL) lozenge 3 mg  1 lozenge Oral PRN Hooten, Illene Labrador, MD       Or  . phenol (CHLORASEPTIC) mouth spray 1 spray  1 spray Mouth/Throat PRN Hooten, Illene Labrador, MD      . metoCLOPramide (REGLAN) tablet 10 mg  10 mg Oral TID AC & HS Hooten, Illene Labrador, MD      . morphine 2 MG/ML injection 2 mg  2 mg Intravenous Q2H PRN Hooten, Illene Labrador, MD      . morphine 2 MG/ML injection           . ondansetron (ZOFRAN) tablet 4 mg  4 mg Oral Q6H PRN Hooten, Illene Labrador, MD       Or  . ondansetron (ZOFRAN) injection 4 mg  4 mg Intravenous Q6H PRN Hooten, Illene Labrador, MD      . oxybutynin (DITROPAN-XL) 24 hr tablet 10 mg  10 mg Oral QHS Hooten, Illene Labrador, MD      . oxyCODONE (Oxy IR/ROXICODONE) immediate release tablet 5-10 mg  5-10 mg Oral Q4H PRN Hooten, Illene Labrador, MD   10 mg at 10/27/16 1300  . pantoprazole (PROTONIX) EC tablet 40 mg  40 mg Oral BID Hooten, Illene Labrador, MD      . polyvinyl alcohol (LIQUIFILM TEARS) 1.4 % ophthalmic solution 1 drop  1 drop Both Eyes TID PRN Hooten, Illene Labrador, MD      . pravastatin (PRAVACHOL) tablet 20 mg  20 mg Oral QHS Hooten, Illene Labrador, MD      . senna-docusate (Senokot-S) tablet 1 tablet  1 tablet Oral BID Hooten, Illene Labrador, MD      . sodium phosphate (FLEET) 7-19 GM/118ML enema 1 enema  1 enema Rectal Once PRN Hooten, Illene Labrador, MD      . timolol (TIMOPTIC) 0.5 % ophthalmic solution 1 drop  1 drop Both Eyes Daily Hooten, Illene Labrador, MD      . traMADol (ULTRAM) tablet 50-100 mg  50-100 mg Oral Q4H PRN Hooten, Illene Labrador,  MD         Discharge Medications: Please see discharge summary for a list of discharge medications.  Relevant Imaging  Results:  Relevant Lab Results:   Additional Information  (SSN: [id number])  Retta Pitcher, Darleen Crocker, LCSW

## 2016-10-28 ENCOUNTER — Encounter: Payer: Self-pay | Admitting: Orthopedic Surgery

## 2016-10-28 LAB — CBC
HEMATOCRIT: 30.2 % — AB (ref 35.0–47.0)
HEMOGLOBIN: 10.4 g/dL — AB (ref 12.0–16.0)
MCH: 31.4 pg (ref 26.0–34.0)
MCHC: 34.2 g/dL (ref 32.0–36.0)
MCV: 91.7 fL (ref 80.0–100.0)
Platelets: 204 10*3/uL (ref 150–440)
RBC: 3.3 MIL/uL — AB (ref 3.80–5.20)
RDW: 13.5 % (ref 11.5–14.5)
WBC: 10.9 10*3/uL (ref 3.6–11.0)

## 2016-10-28 LAB — BASIC METABOLIC PANEL
Anion gap: 6 (ref 5–15)
BUN: 27 mg/dL — ABNORMAL HIGH (ref 6–20)
CHLORIDE: 102 mmol/L (ref 101–111)
CO2: 26 mmol/L (ref 22–32)
Calcium: 8.3 mg/dL — ABNORMAL LOW (ref 8.9–10.3)
Creatinine, Ser: 1.18 mg/dL — ABNORMAL HIGH (ref 0.44–1.00)
GFR calc non Af Amer: 40 mL/min — ABNORMAL LOW (ref >60)
GFR, EST AFRICAN AMERICAN: 46 mL/min — AB (ref >60)
Glucose, Bld: 121 mg/dL — ABNORMAL HIGH (ref 65–99)
Potassium: 4.2 mmol/L (ref 3.5–5.1)
SODIUM: 134 mmol/L — AB (ref 135–145)

## 2016-10-28 NOTE — Anesthesia Postprocedure Evaluation (Signed)
Anesthesia Post Note  Patient: Sarah BeckwithMildred L Moon  Procedure(s) Performed: Procedure(s) (LRB): TOTAL HIP ARTHROPLASTY (Left)  Patient location during evaluation: Nursing Unit Anesthesia Type: Spinal Level of consciousness: awake, awake and alert and oriented Pain management: pain level controlled Vital Signs Assessment: post-procedure vital signs reviewed and stable Respiratory status: spontaneous breathing, nonlabored ventilation and respiratory function stable Cardiovascular status: stable Postop Assessment: no headache, no backache and no signs of nausea or vomiting Anesthetic complications: no     Last Vitals:  Vitals:   10/27/16 2303 10/28/16 0521  BP: (!) 107/56 121/65  Pulse:  79  Resp: 18 19  Temp: 37.1 C 36.9 C    Last Pain:  Vitals:   10/28/16 0615  TempSrc:   PainSc: Asleep                 Danthony Kendrix,  Sheran FavaMark R

## 2016-10-28 NOTE — Care Management Note (Signed)
Case Management Note  Patient Details  Name: Sarah Moon MRN: [id number] Date of Birth: 07-May-1926  Subjective/Objective:   POD # 1 left THA. Met with patient and her daughter at bedside. Daughter will be staying with patient to assist her with ADLS.  Offered choice of home health agencies. Referral to [facility] for HHPT. She will need a walker and BSC. Ordered from [place] with [facility] Pharmacy: [place] [phone]. Called Lovenox 40 mg # 14 no refills.                Action/Plan: [facility] for PT, Lovenox called in. [facility] for walker and [facility] [place]  Expected Discharge Date:  10/29/16               Expected Discharge Plan:  [place]  In-House Referral:     Discharge planning Services  CM Consult  Post Acute Care Choice:  Durable Medical Equipment, Home Health Choice offered to:  Patient, Adult Children  DME Arranged:  Walker rolling, Bedside commode DME Agency:  [place]:  PT [place]:  [facility] (now [facility])  Status of Service:  In process, will continue to follow  If discussed at Long Length of Stay Meetings, dates discussed:    Additional Comments:  Jolly Mango, RN 10/28/2016, 10:18 AM

## 2016-10-28 NOTE — Progress Notes (Signed)
Physical Therapy Treatment Patient Details Name: Sarah BeckwithMildred L Baham MRN: [id number] DOB: 04/18/1926 Today's Date: 10/28/2016    History of Present Illness admitted for acute hospitalization status post L THR (10/27/16), posterior approach, WBAT    PT Comments    Pt in bed, ready for session.  Participated in exercises as described below.  Min/mod assist to EOB.  Pt with some c/o dizziness but resolved with time in sitting and standing.  Min assist to stand and then after static standing to relieve dizziness, was able to ambulate 35' with walker and min guard.  Daughter followed with recliner.  Pt with one small imbalance/left knee weakness but she was able to recover without assist.  Overall did well with mobility despite discomfort.      Follow Up Recommendations  Home health PT     Equipment Recommendations  Rolling walker with 5" wheels;3in1 (PT)    Recommendations for Other Services       Precautions / Restrictions Precautions Precautions: Fall;Posterior Hip Restrictions Weight Bearing Restrictions: Yes LLE Weight Bearing: Weight bearing as tolerated    Mobility  Bed Mobility Overal bed mobility: Needs Assistance Bed Mobility: Supine to Sit     Supine to sit: Min assist;Mod assist     General bed mobility comments: for Le movement and to get upper body fully up of raised HOB  Transfers Overall transfer level: Needs assistance Equipment used: Rolling walker (2 wheeled) Transfers: Sit to/from Stand Sit to Stand: Min assist         General transfer comment: cuing for hand/foot placement with movement transition  Ambulation/Gait Ambulation/Gait assistance: Min assist Ambulation Distance (Feet): 35 Feet Assistive device: Rolling walker (2 wheeled) Gait Pattern/deviations: Step-to pattern;Decreased stance time - left;Decreased stride length   Gait velocity interpretation: Below normal speed for age/gender     Stairs            Wheelchair Mobility     Modified Rankin (Stroke Patients Only)       Balance Overall balance assessment: Needs assistance Sitting-balance support: No upper extremity supported;Feet supported Sitting balance-Leahy Scale: Good     Standing balance support: Bilateral upper extremity supported Standing balance-Leahy Scale: Fair                              Cognition Arousal/Alertness: Awake/alert Behavior During Therapy: WFL for tasks assessed/performed Overall Cognitive Status: Within Functional Limits for tasks assessed                                        Exercises Other Exercises Other Exercises: Supine LE therex, 1x10, AROM for muscular strength/endurance: ankle pumps, quad sets, SAQs, heel slides and hip abduct/adduct    General Comments        Pertinent Vitals/Pain Pain Assessment: 0-10 Pain Score: 6  Pain Location: L hip Pain Descriptors / Indicators: Aching;Operative site guarding Pain Intervention(s): Limited activity within patient's tolerance;Premedicated before session    Home Living                      Prior Function            PT Goals (current goals can now be found in the care plan section) Progress towards PT goals: Progressing toward goals    Frequency    BID      PT Plan Current  plan remains appropriate    Co-evaluation              AM-PAC PT "6 Clicks" Daily Activity  Outcome Measure  Difficulty turning over in bed (including adjusting bedclothes, sheets and blankets)?: Total Difficulty moving from lying on back to sitting on the side of the bed? : Total Difficulty sitting down on and standing up from a chair with arms (e.g., wheelchair, bedside commode, etc,.)?: Total Help needed moving to and from a bed to chair (including a wheelchair)?: A Little Help needed walking in hospital room?: A Little Help needed climbing 3-5 steps with a railing? : A Lot 6 Click Score: 11    End of Session Equipment Utilized  During Treatment: Gait belt Activity Tolerance: Patient tolerated treatment well Patient left: in chair;with call bell/phone within reach;with chair alarm set;with family/visitor present   Pain - Right/Left: Left Pain - part of body: Hip     Time: 1610-9604 PT Time Calculation (min) (ACUTE ONLY): 26 min  Charges:  $Gait Training: 8-22 mins $Therapeutic Exercise: 8-22 mins                    G Codes:       Danielle Dess, PTA 10/28/16, 9:58 AM

## 2016-10-28 NOTE — Progress Notes (Signed)
   Subjective: 1 Day Post-Op Procedure(s) (LRB): TOTAL HIP ARTHROPLASTY (Left) Patient reports pain as 6 on 0-10 scale.   Patient is well, and has had no acute complaints or problems Did well on day 1 with therapy Plan is to go Home after hospital stay. no nausea and no vomiting Patient denies any chest pains or shortness of breath.  Objective: Vital signs in last 24 hours: Temp:  [96.8 F (36 C)-98.8 F (37.1 C)] 98.4 F (36.9 C) (05/17 0521) Pulse Rate:  [54-87] 79 (05/17 0521) Resp:  [10-31] 19 (05/17 0521) BP: (104-156)/(51-86) 121/65 (05/17 0521) SpO2:  [87 %-100 %] 94 % (05/17 0521) Weight:  [78.2 kg (172 lb 8 oz)] 78.2 kg (172 lb 8 oz) (05/16 1200) Heels are non tender and elevated off the bed using rolled towels Intake/Output from previous day: 05/16 0701 - 05/17 0700 In: 3658.3 [P.O.:510; I.V.:2698.3; IV Piggyback:450] Out: 1240 [Urine:890; Drains:275; Blood:75] Intake/Output this shift: No intake/output data recorded.   Recent Labs  10/28/16 0410  HGB 10.4*    Recent Labs  10/28/16 0410  WBC 10.9  RBC 3.30*  HCT 30.2*  PLT 204    Recent Labs  10/28/16 0410  NA 134*  K 4.2  CL 102  CO2 26  BUN 27*  CREATININE 1.18*  GLUCOSE 121*  CALCIUM 8.3*   No results for input(s): LABPT, INR in the last 72 hours.  EXAM General - Patient is Alert, Appropriate and Oriented Extremity - Neurologically intact Neurovascular intact Sensation intact distally Intact pulses distally Dorsiflexion/Plantar flexion intact Compartment soft Dressing - dressing C/D/I Motor Function - intact, moving foot and toes well on exam.    Past Medical History:  Diagnosis Date  . Anxiety   . Arthritis   . B12 deficiency   . Baker's cyst    Rt leg  . Elevated lipids   . Environmental and seasonal allergies   . GERD (gastroesophageal reflux disease)   . High cholesterol   . Hypertension   . Neuropathy     Assessment/Plan: 1 Day Post-Op Procedure(s) (LRB): TOTAL  HIP ARTHROPLASTY (Left) Active Problems:   Status post total replacement of hip  Estimated body mass index is 30.56 kg/m as calculated from the following:   Height as of this encounter: 5\' 3"  (1.6 m).   Weight as of this encounter: 78.2 kg (172 lb 8 oz). Advance diet Up with therapy D/C IV fluids Plan for discharge tomorrow Discharge home with home health  Labs: Were reviewed in expected. DVT Prophylaxis - Lovenox, Foot Pumps and TED hose Weight-Bearing as tolerated to left leg D/C O2 and Pulse OX and try on Room Air Begin working on bowel movement Labs tomorrow morning Sure rolled towels under heels when in bed  [employer] FarmJon R. [facility] CityWolfe PA [facility] 10/28/2016, 7:12 AM

## 2016-10-28 NOTE — Progress Notes (Signed)
Pt. Alert and oriented. POD 1. Vss. Pain controlled with meds per MAR. Neurochecks WDL. IS at the bedside and pt. Encouraged to use it. Heels elevated with towel rolls. Resting quietly. Will continue to monitor.

## 2016-10-28 NOTE — Evaluation (Addendum)
Occupational Therapy Evaluation Patient Details Name: Sarah BeckwithMildred L Moon MRN: [id number] DOB: 03/28/1926 Today's Date: 10/28/2016    History of Present Illness admitted for acute hospitalization status post L THR (10/27/16), posterior approach, WBAT.   Clinical Impression   Pt seen for OT evaluation this date. Pt with daughter at bedside, states that her tramadol has started kicking in and she is getting sleepy, but agreeable to OT session focused on education. Pt independent at baseline, taking seated showers, and using SPC/RW intermittently secondary to L hip pain. Pt presents with deficits in ROM/strength in LLE, activity tolerance, knowledge of AE/DME, and need for increased assistance with self care tasks. Pt will benefit from skilled OT services to address noted impairments and functional deficits to maximize return to PLOF and minimize risk of falls. Recommend HHOT services upon discharge. Notified CM of HHOT rec.     Follow Up Recommendations  Home health OT    Equipment Recommendations  3 in 1 bedside commode;Other (comment) (grab bars)    Recommendations for Other Services       Precautions / Restrictions Precautions Precautions: Fall;Posterior Hip Precaution Booklet Issued: No Precaution Comments: unable to recall precautions; pt/daughter educated in  Restrictions Weight Bearing Restrictions: Yes LLE Weight Bearing: Weight bearing as tolerated      Mobility Bed Mobility Overal bed mobility: Needs Assistance Bed Mobility: Supine to Sit     Supine to sit: Min assist;Mod assist     General bed mobility comments: deferred due to recent pain meds making pt drowsy and fatigue after PT session  Transfers Overall transfer level: Needs assistance          General transfer comment: deferred due to recent pain meds making pt drowsy and fatigue after PT session    Balance Overall balance assessment: Needs assistance                             ADL either  performed or assessed with clinical judgement   ADL Overall ADL's : Needs assistance/impaired Eating/Feeding: Bed level;Set up Eating/Feeding Details (indicate cue type and reason): HOB elevated Grooming: Bed level;Set up   Upper Body Bathing: Supervision/ safety;Sitting;Set up   Lower Body Bathing: Maximal assistance;Sitting/lateral leans   Upper Body Dressing : Set up;Supervision/safety;Sitting   Lower Body Dressing: Maximal assistance;Sitting/lateral leans     Toilet Transfer Details (indicate cue type and reason): deferred due to recent pain meds making pt drowsy and fatigue after PT session           General ADL Comments: pt generally max assist for LB ADL, anticipate pt to improve with education/training in AE      Vision Baseline Vision/History: Wears glasses Wears Glasses: Reading only Patient Visual Report: No change from baseline Vision Assessment?: No apparent visual deficits     Perception     Praxis      Pertinent Vitals/Pain Pain Assessment: No/denies pain Pain Score: 6  Pain Location: L hip is "sore" but no pain reported Pain Descriptors / Indicators: Sore Pain Intervention(s): Limited activity within patient's tolerance;Monitored during session;Premedicated before session;Repositioned     Hand Dominance Right   Extremity/Trunk Assessment Upper Extremity Assessment Upper Extremity Assessment: Overall WFL for tasks assessed   Lower Extremity Assessment Lower Extremity Assessment: Defer to PT evaluation   Cervical / Trunk Assessment Cervical / Trunk Assessment: Normal   Communication Communication Communication: HOH (R>L)   Cognition Arousal/Alertness: Awake/alert Behavior During Therapy: WFL for tasks assessed/performed Overall  Cognitive Status: Within Functional Limits for tasks assessed                                     General Comments       Exercises Other Exercises Other Exercises: pt/dtr educated in AE for LB  dressing, declined to trial due to fatigue/recent pain meds Other Exercises: pt/dtr educated in home/routines modifications and DME to maximize safety/falls prevention Other Exercises: pt/dtr educated in strategies, education, and products to support urinary incontinence management, discussed pelvic floor rehab outpatient at [facility] and pt open to more information Other Exercises: pt/dtr educated in compensatory strategies and supports to ensure pt is able to continue to be adherent with medication mgt   Shoulder Instructions      Home Living Family/patient expects to be discharged to:: Private residence Living Arrangements: Alone Available Help at Discharge: Family;Available 24 hours/day (daughter planning to stay with pt for 2-3 months) Type of Home: House Home Access: Stairs to enter [employer] of Steps: 3 Entrance Stairs-Rails: Right;Left Home Layout: Two level;Able to live on main level with bedroom/bathroom     Bathroom Shower/Tub: Walk-in shower;Door   [employer] Toilet: Standard Bathroom Accessibility: Yes How Accessible: Accessible via walker (narrow walker) Home Equipment: Walker - 2 wheels;Cane - single point   Additional Comments: has wide RW from spouse, doesn't really fit through doorways      Prior Functioning/Environment                   OT Problem List: Decreased range of motion;Decreased knowledge of precautions;Decreased knowledge of use of DME or AE;Decreased activity tolerance      OT Treatment/Interventions: Self-care/ADL training;Energy conservation;Patient/family education;DME and/or AE instruction    OT Goals(Current goals can be found in the care plan section) Acute Rehab OT Goals Patient Stated Goal: to return home with daughter OT Goal Formulation: With patient/family Time For Goal Achievement: 11/11/16 Potential to Achieve Goals: Good  OT Frequency: Min 1X/week   Barriers to D/C:            Co-evaluation               AM-PAC PT "6 Clicks" Daily Activity     Outcome Measure Help from another person eating meals?: None Help from another person taking care of personal grooming?: None Help from another person toileting, which includes using toliet, bedpan, or urinal?: A Lot Help from another person bathing (including washing, rinsing, drying)?: A Lot Help from another person to put on and taking off regular upper body clothing?: None Help from another person to put on and taking off regular lower body clothing?: A Lot 6 Click Score: 18   End of Session    Activity Tolerance: Patient tolerated treatment well;Patient limited by fatigue Patient left: in bed;with call bell/phone within reach;with bed alarm set;with family/visitor present;with SCD's reapplied  OT Visit Diagnosis: Other abnormalities of gait and mobility (R26.89);History of falling (Z91.81)                Time: 1610-9604 OT Time Calculation (min): 56 min Charges:  OT General Charges $OT Visit: 1 Procedure OT Evaluation $OT Eval Low Complexity: 1 Procedure OT Treatments $Self Care/Home Management : 38-52 mins G-Codes:     Richrd Prime, MPH, MS, OTR/L ascom [phone] 10/28/16, 1:34 PM

## 2016-10-28 NOTE — Discharge Summary (Signed)
Physician Discharge Summary  Patient ID: Sarah Moon MRN: [id number] DOB/AGE: Jul 15, 1925 83 y.o.  Admit date: 10/27/2016 Discharge date: 10/29/2016  Admission Diagnoses:  primary osteoarthritis of left hip   Discharge Diagnoses: Patient Active Problem List   Diagnosis Date Noted  . Status post total replacement of hip 10/27/2016    Past Medical History:  Diagnosis Date  . Anxiety   . Arthritis   . B12 deficiency   . Baker's cyst    Rt leg  . Elevated lipids   . Environmental and seasonal allergies   . GERD (gastroesophageal reflux disease)   . High cholesterol   . Hypertension   . Neuropathy      Transfusion: No transfusions during this admission   Consultants (if any):   Discharged Condition: Improved  Hospital Course: Sarah Moon is an 83 y.o. female who was admitted 10/27/2016 with a diagnosis of degenerative arthrosis left hip and went to the operating room on 10/27/2016 and underwent the above named procedures.    Surgeries:Procedure(s): TOTAL HIP ARTHROPLASTY on 10/27/2016  PRE-OPERATIVE DIAGNOSIS: Degenerative arthrosis of the left hip, primary  POST-OPERATIVE DIAGNOSIS:  Same  PROCEDURE:  Left total hip arthroplasty  SURGEON:  Jena Gauss. M.D.  ASSISTANT:  Van Clines, PA (present and scrubbed throughout the case, critical for assistance with exposure, retraction, instrumentation, and closure)  ANESTHESIA: spinal  ESTIMATED BLOOD LOSS: 75 mL  FLUIDS REPLACED: 1100 mL of crystalloid  DRAINS: 2 medium drains to a Hemovac reservoir  IMPLANTS UTILIZED: DePuy 13.5 mm small stature AML femoral stem, 54 mm OD Pinnacle 100 acetabular component, neutral Pinnacle Marathon polyethylene insert, and a 36 mm M-SPEC +1.5 mm hip ball  INDICATIONS FOR SURGERY: Sarah Moon is a 83 y.o. year old female with a long history of progressive hip and groin  pain. X-rays demonstrated severe degenerative changes. The patient had not seen any  significant improvement despite conservative nonsurgical intervention. After discussion of the risks and benefits of surgical intervention, the patient expressed understanding of the risks benefits and agree with plans for total hip arthroplasty.   The risks, benefits, and alternatives were discussed at length including but not limited to the risks of infection, bleeding, nerve injury, stiffness, blood clots, the need for revision surgery, limb length inequality, dislocation, cardiopulmonary complications, among others, and they were willing to proceed. Patient tolerated the surgery well. No complications .Patient was taken to PACU where she was stabilized and then transferred to the orthopedic floor.  Patient started on Lovenox 30 mg q 12 hrs. Foot pumps applied bilaterally at 80 mm hgb. Heels elevated off bed with rolled towels. No evidence of DVT. Calves non tender. Negative Homan. Physical therapy started on day #1 for gait training and transfer with OT starting on  day #1 for ADL and assisted devices. Patient has done well with therapy. Ambulated greater than 200 feet upon being discharged. Was able to ascend and descend 4 steps safely and independently  Patient's IV And Foley were discontinued on day #1 with Hemovac being discontinued on day #2. Dressing was changed on day 2 prior to patient being discharged   She was given perioperative antibiotics:  Anti-infectives    Start     Dose/Rate Route Frequency Ordered Stop   10/27/16 1400  clindamycin (CLEOCIN) IVPB 600 mg     600 mg 100 mL/hr over 30 Minutes Intravenous Every 6 hours 10/27/16 1209 10/28/16 1359   10/27/16 0610  clindamycin (CLEOCIN) 900 MG/50ML IVPB  CommentsEvelina Moon:  Sarah Moon, Sarah Moon: cabinet override      10/27/16 0610 10/27/16 0757   10/27/16 0600  clindamycin (CLEOCIN) IVPB 900 mg     900 mg 100 mL/hr over 30 Minutes Intravenous On call to O.R. 10/26/16 2246 10/27/16 [id number]    .  She was fitted with AV 1 compression foot  pump devices, instructed on heel pumps, early ambulation, and fitted with TED stockings bilaterally for DVT prophylaxis.  She benefited maximally from the hospital stay and there were no complications.    Recent vital signs:  Vitals:   10/27/16 2303 10/28/16 0521  BP: (!) 107/56 121/65  Pulse:  79  Resp: 18 19  Temp: 98.8 F (37.1 C) 98.4 F (36.9 C)    Recent laboratory studies:  Lab Results  Component Value Date   HGB 10.4 (L) 10/28/2016   HGB 13.0 10/13/2016   Lab Results  Component Value Date   WBC 10.9 10/28/2016   PLT 204 10/28/2016   Lab Results  Component Value Date   INR 1.05 10/13/2016   Lab Results  Component Value Date   NA 134 (L) 10/28/2016   K 4.2 10/28/2016   CL 102 10/28/2016   CO2 26 10/28/2016   BUN 27 (H) 10/28/2016   CREATININE 1.18 (H) 10/28/2016   GLUCOSE 121 (H) 10/28/2016    Discharge Medications:   Allergies as of 10/29/2016      Reactions   Augmentin [amoxicillin-pot Clavulanate] Nausea Only, Rash, Other (See Comments)   GI upset GI upset Has patient had a PCN reaction causing immediate rash, facial/tongue/throat swelling, SOB or lightheadedness with hypotension:No Has patient had a PCN reaction causing severe rash involving mucus membranes or skin necrosis:No Has patient had a PCN reaction that required hospitalization:No Has patient had a PCN reaction occurring within the last 10 years:Yes--upset stomach If all of the above answers are "NO", then may proceed with Cephalosporin use.      Medication List    TAKE these medications   acetaminophen 500 MG tablet Commonly known as:  TYLENOL Take 500 mg by mouth every 6 (six) hours as needed (for pain.). Gel cap   ALPRAZolam 0.25 MG tablet Commonly known as:  XANAX Take 0.25 mg by mouth 2 (two) times daily as needed (for anxiety/sleep.).   ASPERCREME LIDOCAINE EX Apply 1 application topically 3 (three) times daily as needed (for hip pain.).   BIOFREEZE EX Apply 1 application  topically 4 (four) times daily as needed (for hip pain.).   calcium carbonate 1500 (600 Ca) MG Tabs tablet Commonly known as:  OSCAL Take 600 mg by mouth 2 (two) times daily.   citalopram 20 MG tablet Commonly known as:  CELEXA Take 20 mg by mouth daily.   COMBIGAN 0.2-0.5 % ophthalmic solution Generic drug:  brimonidine-timolol Place 1 drop into both eyes every morning.   cyanocobalamin 1000 MCG/ML injection Commonly known as:  (VITAMIN B-12) Inject 1,000 mcg into the muscle every 30 (thirty) days.   diclofenac 50 MG EC tablet Commonly known as:  VOLTAREN Take 50 mg by mouth 2 (two) times daily as needed.   enoxaparin 30 MG/0.3ML injection Commonly known as:  LOVENOX Inject 0.4 mLs (40 mg total) into the skin daily.   fluticasone 50 MCG/ACT nasal spray Commonly known as:  FLONASE Place 1 spray into both nostrils daily as needed for allergies or rhinitis.   hydroxypropyl methylcellulose / hypromellose 2.5 % ophthalmic solution Commonly known as:  ISOPTO TEARS / GONIOVISC Place 1 drop  into both eyes 3 (three) times daily as needed (for dry/allergy eyes.).   levocetirizine 5 MG tablet Commonly known as:  XYZAL Take 5 mg by mouth at bedtime as needed for allergies.   LUMIGAN 0.01 % Soln Generic drug:  bimatoprost Place 1 drop into both eyes at bedtime.   oxybutynin 10 MG 24 hr tablet Commonly known as:  DITROPAN-XL Take 10 mg by mouth at bedtime.   oxyCODONE 5 MG immediate release tablet Commonly known as:  Oxy IR/ROXICODONE Take 1-2 tablets (5-10 mg total) by mouth every 4 (four) hours as needed for severe pain.   pravastatin 20 MG tablet Commonly known as:  PRAVACHOL Take 20 mg by mouth at bedtime.   quinapril-hydrochlorothiazide 20-12.5 MG tablet Commonly known as:  ACCURETIC Take 1 tablet by mouth daily.   ranitidine 150 MG tablet Commonly known as:  ZANTAC Take 150 mg by mouth 2 (two) times daily as needed (for acid reflux.).   traMADol 50 MG  tablet Commonly known as:  ULTRAM Take 1-2 tablets (50-100 mg total) by mouth every 4 (four) hours as needed for moderate pain.            Durable Medical Equipment        Start     Ordered   10/27/16 1208  DME Walker rolling  Once    Question:  Patient needs a walker to treat with the following condition  Answer:  S/P total hip arthroplasty   10/27/16 1209   10/27/16 1208  DME Bedside commode  Once    Question:  Patient needs a bedside commode to treat with the following condition  Answer:  S/P total hip arthroplasty   10/27/16 1209      Diagnostic Studies: Ct Head Wo Contrast  Result Date: 10/03/2016 CLINICAL DATA:  83 year old female with history of trauma from a fall with injury to the occipital area today. EXAM: CT HEAD WITHOUT CONTRAST TECHNIQUE: Contiguous axial images were obtained from the base of the skull through the vertex without intravenous contrast. COMPARISON:  MRI of the brain 05/20/2014. FINDINGS: Brain: Patchy and confluent areas of decreased attenuation are noted throughout the deep and periventricular white matter of the cerebral hemispheres bilaterally, compatible with chronic microvascular ischemic disease. Old lacunar infarct in the head of the left caudate nucleus. Tiny dystrophic calcification in the right frontal lobe white matter. No evidence of acute infarction, hemorrhage, hydrocephalus, extra-axial collection or mass lesion/mass effect. Vascular: No hyperdense vessel or unexpected calcification. Skull: Normal. Negative for fracture or focal lesion. Sinuses/Orbits: No acute finding. Other: None. IMPRESSION: 1. No acute intracranial abnormalities. Specifically, no signs of significant acute traumatic injury to the skull or brain. 2. Mild chronic microvascular ischemic changes in the cerebral white matter. Old lacunar infarct in the head of the left caudate nucleus. Electronically Signed   By: Trudie Reed M.D.   On: 10/03/2016 16:25   Dg Hip Port Unilat  With Pelvis 1v Left  Result Date: 10/27/2016 CLINICAL DATA:  Status post total hip replacement EXAM: DG HIP (WITH OR WITHOUT PELVIS) 2V PORT LEFT COMPARISON:  None. FINDINGS: Frontal mid to lower pelvis and lateral left hip images obtained. There is a total hip replacement on the left with prosthetic components well-seated. No acute fracture or dislocation. There is moderate osteoarthritic change in the right hip joint. Skin staples are noted on the left. IMPRESSION: Total hip replacement on the left with prosthetic components well-seated. No acute fracture or dislocation. Moderate osteoarthritic change in right hip  joint. Electronically Signed   By: Bretta Bang III M.D.   On: 10/27/2016 11:29    Disposition: 01-Home or Self Care    Follow-up Information    Donato Heinz, MD On 12/09/2016.   Specialty:  Orthopedic Surgery Why:  at 10:45am Contact information: [address] [facility] [place] [address] [phone]            Signed: Tera Partridge 10/28/2016, 7:21 AM

## 2016-10-28 NOTE — Progress Notes (Signed)
Physical Therapy Treatment Patient Details Name: Sarah Moon MRN: [id number] DOB: 02/15/1926 Today's Date: 10/28/2016    History of Present Illness admitted for acute hospitalization status post L THR (10/27/16), posterior approach, WBAT.    PT Comments    Pt c/o general increase in pain this pm but willing to participate.  Bed mobility with min/mod assist.  She was able to stand and ambulate 35' with walker and min guard/assist this pm. Gait seemed overall more difficult for pt due to pain and fatigue.  Verbal cues for general walker safety and for safe turns.  Returned to bed after gait to rest.  Daughter in attendance for session.   Follow Up Recommendations  Home health PT     Equipment Recommendations  Rolling walker with 5" wheels;3in1 (PT)    Recommendations for Other Services       Precautions / Restrictions Precautions Precautions: Fall;Posterior Hip Precaution Booklet Issued: No Precaution Comments: unable to recall precautions; pt/daughter educated in  Restrictions Weight Bearing Restrictions: Yes LLE Weight Bearing: Weight bearing as tolerated    Mobility  Bed Mobility Overal bed mobility: Needs Assistance Bed Mobility: Supine to Sit;Sit to Supine     Supine to sit: Min assist;Mod assist Sit to supine: Min assist;Mod assist   General bed mobility comments: continues to require assist for LLE and to raise trunk up off raised bed.  Transfers Overall transfer level: Needs assistance Equipment used: Rolling walker (2 wheeled) Transfers: Sit to/from Stand Sit to Stand: Min assist         General transfer comment: deferred due to recent pain meds making pt drowsy and fatigue after PT session  Ambulation/Gait Ambulation/Gait assistance: Min assist Ambulation Distance (Feet): 35 Feet Assistive device: Rolling walker (2 wheeled) Gait Pattern/deviations: Step-to pattern;Decreased stance time - left;Decreased stride length   Gait velocity  interpretation: Below normal speed for age/gender General Gait Details: gait more laborous this pm due to general increase in pain   Stairs            Wheelchair Mobility    Modified Rankin (Stroke Patients Only)       Balance Overall balance assessment: Needs assistance Sitting-balance support: No upper extremity supported;Feet supported Sitting balance-Leahy Scale: Good     Standing balance support: Bilateral upper extremity supported Standing balance-Leahy Scale: Fair                              Cognition Arousal/Alertness: Awake/alert Behavior During Therapy: WFL for tasks assessed/performed Overall Cognitive Status: Within Functional Limits for tasks assessed                                        Exercises Other Exercises Other Exercises: pt/dtr educated in AE for LB dressing, declined to trial due to fatigue/recent pain meds Other Exercises: pt/dtr educated in home/routines modifications and DME to maximize safety/falls prevention Other Exercises: pt/dtr educated in strategies, education, and products to support urinary incontinence management, discussed pelvic floor rehab outpatient at [facility] and pt open to more information Other Exercises: pt/dtr educated in compensatory strategies and supports to ensure pt is able to continue to be adherent with medication mgt    General Comments        Pertinent Vitals/Pain Pain Assessment: No/denies pain Pain Score: 8  Pain Location: general increase in pain this pm Pain Descriptors /  Indicators: Sore;Operative site guarding Pain Intervention(s): Limited activity within patient's tolerance;Monitored during session    Home Living Family/patient expects to be discharged to:: Private residence Living Arrangements: Alone Available Help at Discharge: Family;Available 24 hours/day (daughter planning to stay with pt for 2-3 months) Type of Home: House Home Access: Stairs to enter Entrance  Stairs-Rails: Right;Left Home Layout: Two level;Able to live on main level with bedroom/bathroom Home Equipment: Dan HumphreysWalker - 2 wheels;Cane - single point Additional Comments: has wide RW from spouse, doesn't really fit through doorways    Prior Function            PT Goals (current goals can now be found in the care plan section) Acute Rehab PT Goals Patient Stated Goal: to return home with daughter Progress towards PT goals: Progressing toward goals    Frequency    BID      PT Plan Current plan remains appropriate    Co-evaluation              AM-PAC PT "6 Clicks" Daily Activity  Outcome Measure  Difficulty turning over in bed (including adjusting bedclothes, sheets and blankets)?: A Little Difficulty moving from lying on back to sitting on the side of the bed? : Total Difficulty sitting down on and standing up from a chair with arms (e.g., wheelchair, bedside commode, etc,.)?: Total Help needed moving to and from a bed to chair (including a wheelchair)?: A Little Help needed walking in hospital room?: A Little Help needed climbing 3-5 steps with a railing? : A Lot 6 Click Score: 13    End of Session Equipment Utilized During Treatment: Gait belt Activity Tolerance: Patient tolerated treatment well;Patient limited by pain Patient left: in bed;with bed alarm set;with call bell/phone within reach;with family/visitor present   Pain - Right/Left: Left Pain - part of body: Hip     Time: 1350-1408 PT Time Calculation (min) (ACUTE ONLY): 18 min  Charges:  $Gait Training: 8-22 mins                    G Codes:       Sarah DessSarah Devion Moon, PTA 10/28/16, 2:14 PM

## 2016-10-28 NOTE — Progress Notes (Signed)
Foley d/c'd at 0525 

## 2016-10-28 NOTE — Discharge Instructions (Signed)

## 2016-10-28 NOTE — Progress Notes (Signed)
Clinical Social Worker (CSW) received SNF consult. PT is recommending home health. RN case manager aware of above. Please reconsult if future social work needs arise. CSW signing off.   Thoren Hosang, LCSW [phone] 

## 2016-10-29 LAB — CBC
HEMATOCRIT: 30.1 % — AB (ref 35.0–47.0)
Hemoglobin: 10.1 g/dL — ABNORMAL LOW (ref 12.0–16.0)
MCH: 30.4 pg (ref 26.0–34.0)
MCHC: 33.5 g/dL (ref 32.0–36.0)
MCV: 91 fL (ref 80.0–100.0)
Platelets: 195 10*3/uL (ref 150–440)
RBC: 3.31 MIL/uL — ABNORMAL LOW (ref 3.80–5.20)
RDW: 13.3 % (ref 11.5–14.5)
WBC: 14.8 10*3/uL — AB (ref 3.6–11.0)

## 2016-10-29 LAB — BASIC METABOLIC PANEL
ANION GAP: 7 (ref 5–15)
BUN: 27 mg/dL — ABNORMAL HIGH (ref 6–20)
CALCIUM: 8.5 mg/dL — AB (ref 8.9–10.3)
CO2: 25 mmol/L (ref 22–32)
Chloride: 103 mmol/L (ref 101–111)
Creatinine, Ser: 1.17 mg/dL — ABNORMAL HIGH (ref 0.44–1.00)
GFR calc Af Amer: 47 mL/min — ABNORMAL LOW (ref >60)
GFR calc non Af Amer: 40 mL/min — ABNORMAL LOW (ref >60)
GLUCOSE: 111 mg/dL — AB (ref 65–99)
Potassium: 3.6 mmol/L (ref 3.5–5.1)
Sodium: 135 mmol/L (ref 135–145)

## 2016-10-29 LAB — SURGICAL PATHOLOGY

## 2016-10-29 MED ORDER — LACTULOSE 10 GM/15ML PO SOLN: 10.0000 g | Freq: Two times a day (BID) | ORAL | Status: DC | PRN

## 2016-10-29 MED ORDER — TRAMADOL HCL 50 MG PO TABS: 50.0000 mg | ORAL_TABLET | ORAL | 0 refills | Status: DC | PRN

## 2016-10-29 MED ORDER — ENOXAPARIN SODIUM 30 MG/0.3ML ~~LOC~~ SOLN: 40.0000 mg | SUBCUTANEOUS | 0 refills | Status: DC

## 2016-10-29 MED ORDER — OXYCODONE HCL 5 MG PO TABS: 5.0000 mg | ORAL_TABLET | ORAL | 0 refills | Status: DC | PRN

## 2016-10-29 NOTE — Progress Notes (Signed)
Dr. Marry Guan and this writer spent approximately one hour at the bedside with pt and daughter, answering questions and assisting pt's daughter with understanding plan of care for pt at discharge. Pt's daughter upset that medicare may not pay for another overnight her in hospital, verbalized her fears that she will hurt her mother while caring for her, etc. Dr. Marry Guan explained several different ways that patient has strengths and that pt has met all goals to be safe at home, etc. Pt and daughter agreeable to discharge. This Probation officer removed piv from pt's l arm with catheter intact.

## 2016-10-29 NOTE — Care Management Note (Addendum)
Case Management Note  Patient Details  Name: Sarah Moon MRN: [id number] Date of Birth: [birth year]  Subjective/Objective:  Cost of Lovenox is $ 3.35. Patient updated.   Daughter wants patient to stay another night. I explained to her that she has met all the requirements for discharge and if the MD deemed she was medically stable to be discharged she would have to be discharged home. Further explained that Medicare would not pay for an addidtion night that was not medically necessary. She could get billed for hospital stay if Medicare denied it. Daughter verbalized understanding.               Action/Plan:   Expected Discharge Date:  10/29/16               Expected Discharge Plan:  [place]  In-House Referral:     Discharge planning Services  CM Consult  Post Acute Care Choice:  Durable Medical Equipment, Home Health Choice offered to:  Patient, Adult Children  DME Arranged:  Walker rolling, Bedside commode DME Agency:  [place]:  PT [place]:  [facility], [facility], [facility] (now [facility])  Status of Service:  Completed, signed off  If discussed at [employer] of Stay Meetings, dates discussed:    Additional Comments:  Jolly Mango, RN 10/29/2016, 2:30 PM

## 2016-10-29 NOTE — Progress Notes (Signed)
Occupational Therapy Treatment Patient Details Name: Sarah Moon MRN: [id number] DOB: 08-22-1925 Today's Date: 10/29/2016    History of present illness admitted for acute hospitalization status post L THR (10/27/16), posterior approach, WBAT.   OT comments  Pt was able to sit at EOB with min assist and cues for LLE and needed bed rail to help achieve upright position but no assist needed, only cues to get hips to edge.  Sit to stand with FWW to transfer to recliner with min assist and cues.  Rec a walker bag to keep cell phone, etc with her at all times .Also rec purchasing a new reacher since hers at home has suction cups, hard plastic sock aid and use rubber soled slippers at home vs the hospital socks since she has carpet., pad the edge of her bedside table where BSC will be to prevent any injury if she loses her balance getting up out of bed. Pt was able to transfer from EOB to recliner using FWW with min assist and cues.  NSG present in room to discuss discharge plan of going home at very end of session.  Follow Up Recommendations  Home health OT    Equipment Recommendations  3 in 1 bedside commode;Other (comment) (reacher, bed rails, sock aid, walker bag)    Recommendations for Other Services      Precautions / Restrictions Precautions Precautions: Posterior Hip;Fall Precaution Booklet Issued: Yes (comment) Restrictions Weight Bearing Restrictions: Yes LLE Weight Bearing: Weight bearing as tolerated       Mobility Bed Mobility               General bed mobility comments: seated in recliner beginning/end of treatment session  Transfers Overall transfer level: Needs assistance Equipment used: Rolling walker (2 wheeled) Transfers: Sit to/from Stand Sit to Stand: Supervision;Min guard         General transfer comment: improving awareness of hand and L LE placement/position    Balance Overall balance assessment: Needs assistance Sitting-balance support: No  upper extremity supported;Feet supported Sitting balance-Leahy Scale: Good     Standing balance support: Bilateral upper extremity supported Standing balance-Leahy Scale: Fair                             ADL either performed or assessed with clinical judgement   ADL Overall ADL's : Needs assistance/impaired                     Lower Body Dressing: Set up;Minimal assistance;Adhering to hip precautions;Cueing for sequencing;Sit to/from stand;With adaptive equipment Lower Body Dressing Details (indicate cue type and reason): Pt able to use reacher and sock aid sitting EOB with cues for hip precautions at first but was able to recall after review.  Pt's daughter also present and educated in use of AD and where to purchase since she is going home today.                General ADL Comments: Pt was able to sit at EOB with min assist and cues for LLE and needed bed rail to help achieve upright position but no assist needed, only cues to get hips to edge.  Sit to stand with FWW to transfer to recliner with min assist and cues.  Rec a walker bag to keep cell phone, etc with her at all times.Also rec purchasing a new reacher since hers at home has suction cups, hard plastic sock aid and  use rubber soled slippers at home vs the hospital socks since she has carpet., pad the edge of her bedside table where BSC will be to prevent any injury if she loses her balance getting up out of bed.       Vision Baseline Vision/History: Wears glasses Wears Glasses: Reading only Patient Visual Report: No change from baseline     Perception     Praxis      Cognition Arousal/Alertness: Awake/alert Behavior During Therapy: WFL for tasks assessed/performed Overall Cognitive Status: Within Functional Limits for tasks assessed                                          Exercises Other Exercises Other Exercises: Verbally reviewed technique for car transfers;  patient/daughter voiced understanding. Other Exercises: Issued handout with LE supine therex for use as HEP.   Shoulder Instructions       General Comments      Pertinent Vitals/ Pain       Pain Assessment: 0-10 Pain Score: 6  Pain Location: L hip Pain Descriptors / Indicators: Sore;Operative site guarding Pain Intervention(s): Limited activity within patient's tolerance;Monitored during session;Premedicated before session;Repositioned  Home Living                                          Prior Functioning/Environment              Frequency  Min 1X/week        Progress Toward Goals  OT Goals(current goals can now be found in the care plan section)  Progress towards OT goals: Progressing toward goals  Acute Rehab OT Goals Patient Stated Goal: to return home with daughter OT Goal Formulation: With patient/family Time For Goal Achievement: 11/11/16 Potential to Achieve Goals: Good  Plan Discharge plan remains appropriate    Co-evaluation                 AM-PAC PT "6 Clicks" Daily Activity     Outcome Measure   Help from another person eating meals?: None Help from another person taking care of personal grooming?: None Help from another person toileting, which includes using toliet, bedpan, or urinal?: A Little Help from another person bathing (including washing, rinsing, drying)?: A Lot Help from another person to put on and taking off regular upper body clothing?: None Help from another person to put on and taking off regular lower body clothing?: A Little 6 Click Score: 20    End of Session Equipment Utilized During Treatment: Gait belt  OT Visit Diagnosis: Other abnormalities of gait and mobility (R26.89);History of falling (Z91.81)   Activity Tolerance Patient tolerated treatment well   Patient Left in chair;with call bell/phone within reach;with chair alarm set;with family/visitor present;Other (comment) (NSG present as  well)   Nurse Communication          Time: [id number] OT Time Calculation (min): 45 min  Charges: OT General Charges $OT Visit: 1 Procedure OT Treatments $Self Care/Home Management : 38-52 mins    Susanne BordersSusan Ismael Treptow, OTR/L ascom [phone] 10/29/16, 3:01 PM

## 2016-10-29 NOTE — Progress Notes (Addendum)
Pt oob to recliner on rounds at 1400. Pt's daughter at bedside and OT eval had just finished. Pt's daughter stated that she had spoke to care management who explained to her that if pt has performed all of the tasks need to demonstrate safety at home, that insurance would not pay for pt to stay until tomorrow morning. Pt's daughter stated the her mother is unable to get oob without some type of railing to hold onto and that her (daughter's) back is too weak to help pull her her mother to standing.Pt's daughter intends to go out tomorrow to get equipment recommended by pt.

## 2016-10-29 NOTE — Progress Notes (Signed)
Shift assessment completed. Pt is awake, c/o pain 8/10 to L hip, pt received tramadol. O2 sat initially 90%, pt asked to deep breathe, sat increased to 95%. Pt is alert and oriented, Hr is regular, abdomen is soft, bs hypoactive. Pt has honeycomb dressing intact to l hip, no drainage noted. Pt's daughter at bedside and stated that PA came and dc'd hemovac drain this am. Dr. Ernest PineHooten in to see pt as well on rounds. Teds on bilat, foot pumps in place, ppp, pt is able to move her feet and toes. piv #20 intact to l fa, site is free of redness and swelling. At this time, pt is working with physical therapy and walking around the unit.

## 2016-10-29 NOTE — Progress Notes (Signed)
PT Cancellation Note  Patient Details Name: Sarah BeckwithMildred L Moon MRN: [id number] DOB: 08/03/1925   Cancelled Treatment:    Reason Eval/Treat Not Completed: Patient at procedure or test/unavailable (Multiple attempts to see patient this PM.  Initial attempt, patient with OT.  Second attempt, daughter not present and patient denied further questions/concerns regarding planned discharge.  Third and fourth attempt, patient/daughter with Dr. Ernest PineHooten at bedside. If patient remains in hospital, will re-attempt next AM as appropriate.)   Sarah Moon, PT, DPT, NCS 10/29/16, 4:38 PM [phone]

## 2016-10-29 NOTE — Plan of Care (Signed)
Problem: Skin Integrity: Goal: Signs of wound healing will improve Outcome: Completed/Met Date Met: 10/29/16 Pt has met all goals for discharge.

## 2016-10-29 NOTE — Progress Notes (Signed)
   Subjective: 2 Days Post-Op Procedure(s) (LRB): TOTAL HIP ARTHROPLASTY (Left) Patient reports pain as 2 on 0-10 scale.   Patient complained of a lot of pain during the day yesterday. Complains of a lot of pins and needles on the incision site. Pain did not appear to be an issue yesterday but more fatigue. Patient is well, and has had no acute complaints or problems Physical therapy is a little behind for patient going home. Plan is to go Home after hospital stay. no nausea and no vomiting Patient denies any chest pains or shortness of breath. Objective: Vital signs in last 24 hours: Temp:  [98 F (36.7 C)-100 F (37.8 C)] 98.3 F (36.8 C) (05/18 0350) Pulse Rate:  [64-98] 91 (05/18 0354) Resp:  [18] 18 (05/18 0350) BP: (100-121)/(48-59) 110/59 (05/18 0350) SpO2:  [85 %-96 %] 96 % (05/18 0354) well approximated incision Heels are non tender and elevated off the bed using rolled towels Intake/Output from previous day: 05/17 0701 - 05/18 0700 In: 360 [P.O.:360] Out: 130 [Urine:125; Drains:5] Intake/Output this shift: No intake/output data recorded.   Recent Labs  10/28/16 0410 10/29/16 0323  HGB 10.4* 10.1*    Recent Labs  10/28/16 0410 10/29/16 0323  WBC 10.9 14.8*  RBC 3.30* 3.31*  HCT 30.2* 30.1*  PLT 204 195    Recent Labs  10/28/16 0410 10/29/16 0323  NA 134* 135  K 4.2 3.6  CL 102 103  CO2 26 25  BUN 27* 27*  CREATININE 1.18* 1.17*  GLUCOSE 121* 111*  CALCIUM 8.3* 8.5*   No results for input(s): LABPT, INR in the last 72 hours.  EXAM General - Patient is Alert, Appropriate and Oriented Extremity - Neurologically intact Neurovascular intact Sensation intact distally Intact pulses distally Dorsiflexion/Plantar flexion intact No cellulitis present Compartment soft Dressing - dressing C/D/I Motor Function - intact, moving foot and toes well on exam.    Past Medical History:  Diagnosis Date  . Anxiety   . Arthritis   . B12 deficiency    . Baker's cyst    Rt leg  . Elevated lipids   . Environmental and seasonal allergies   . GERD (gastroesophageal reflux disease)   . High cholesterol   . Hypertension   . Neuropathy     Assessment/Plan: 2 Days Post-Op Procedure(s) (LRB): TOTAL HIP ARTHROPLASTY (Left) Active Problems:   Status post total replacement of hip  Estimated body mass index is 30.56 kg/m as calculated from the following:   Height as of this encounter: 5\' 3"  (1.6 m).   Weight as of this encounter: 78.2 kg (172 lb 8 oz). Up with therapy Discharge home with home health  Labs: Were reviewed and felt to be acceptable. DVT Prophylaxis - Lovenox, Foot Pumps and TED hose Weight-Bearing as tolerated to left leg Hemovac was discontinued on today's visit. Patient needs a bowel movement. We'll add lactulose. Plan on trying to get the patient about the hospital this afternoon but may be tomorrow morning.  Lynnda ShieldsJon R. Orthopa[facility]ty Surgery CenterWolfe PA [facility] 10/29/2016, 7:18 AM

## 2016-10-29 NOTE — Progress Notes (Signed)
Physical Therapy Treatment Patient Details Name: Sarah Moon MRN: [id number] DOB: April 07, 1926 Today's Date: 10/29/2016    History of Present Illness admitted for acute hospitalization status post L THR (10/27/16), posterior approach, WBAT.    PT Comments    Marked improvement in gait performance, tolerating increased distance (220') with decreased physical assist.  Able to initiate stair training, up/down 4 with single rail and SPC, min assist.  Reviewed positioning/guarding technique with both patient and daughter; both comfortable with current level of assist. Patient very motivated; daughter very supportive. Daughter does have questions regarding bed mobility; will plan to review in PM session.    Follow Up Recommendations  Home health PT     Equipment Recommendations  Rolling walker with 5" wheels;3in1 (PT)    Recommendations for Other Services       Precautions / Restrictions Precautions Precautions: Fall;Posterior Hip Precaution Booklet Issued: Yes (comment) Restrictions Weight Bearing Restrictions: Yes LLE Weight Bearing: Weight bearing as tolerated    Mobility  Bed Mobility               General bed mobility comments: seated in recliner beginning/end of treatment session  Transfers Overall transfer level: Needs assistance Equipment used: Rolling walker (2 wheeled) Transfers: Sit to/from Stand Sit to Stand: Supervision;Min guard         General transfer comment: improving awareness of hand and L LE placement/position  Ambulation/Gait Ambulation/Gait assistance: Min guard Ambulation Distance (Feet): 220 Feet Assistive device: Rolling walker (2 wheeled)       General Gait Details: cuing for continuous, step through gait pattern with increased stance time L LE (decreased force of contact R LE).  Several standing rest breaks throughout distance due to UE fatigue; encouraged for increased WBing bilat LEs as tolerated.   Stairs Stairs: Yes    Stair Management: One rail Right;With cane Number of Stairs: 4 General stair comments: initial 2 steps with bilat rails, cga/min assist; 3-4 steps with R rail and SPC (to simulate home environment).  Min cuing for recall of technique; fair/good stability in L LE.  Reviewed guarding/positioning/level of assist with patient and daughter; both voiced understanding and comfort with process.  Wheelchair Mobility    Modified Rankin (Stroke Patients Only)       Balance Overall balance assessment: Needs assistance Sitting-balance support: No upper extremity supported;Feet supported Sitting balance-Leahy Scale: Good     Standing balance support: Bilateral upper extremity supported Standing balance-Leahy Scale: Fair                              Cognition Arousal/Alertness: Awake/alert Behavior During Therapy: WFL for tasks assessed/performed Overall Cognitive Status: Within Functional Limits for tasks assessed                                        Exercises Other Exercises Other Exercises: Verbally reviewed technique for car transfers; patient/daughter voiced understanding. Other Exercises: Issued handout with LE supine therex for use as HEP.    General Comments        Pertinent Vitals/Pain Pain Assessment: 0-10 Pain Score: 7  Pain Location: L hip Pain Descriptors / Indicators: Sore;Operative site guarding Pain Intervention(s): Limited activity within patient's tolerance;Monitored during session;Premedicated before session;Repositioned    Home Living  Prior Function            PT Goals (current goals can now be found in the care plan section) Acute Rehab PT Goals Patient Stated Goal: to return home with daughter PT Goal Formulation: With patient/family Time For Goal Achievement: 11/10/16 Potential to Achieve Goals: Good Progress towards PT goals: Progressing toward goals    Frequency    BID      PT  Plan Current plan remains appropriate    Co-evaluation              AM-PAC PT "6 Clicks" Daily Activity  Outcome Measure  Difficulty turning over in bed (including adjusting bedclothes, sheets and blankets)?: A Little Difficulty moving from lying on back to sitting on the side of the bed? : Total Difficulty sitting down on and standing up from a chair with arms (e.g., wheelchair, bedside commode, etc,.)?: Total Help needed moving to and from a bed to chair (including a wheelchair)?: A Little Help needed walking in hospital room?: A Little Help needed climbing 3-5 steps with a railing? : A Little 6 Click Score: 14    End of Session Equipment Utilized During Treatment: Gait belt Activity Tolerance: Patient tolerated treatment well Patient left: in chair;with call bell/phone within reach;with nursing/sitter in room (RN at bedside to administer suppository; to connect alarm when complete) Nurse Communication: Mobility status PT Visit Diagnosis: Pain;Muscle weakness (generalized) (M62.81);Difficulty in walking, not elsewhere classified (R26.2) Pain - Right/Left: Left Pain - part of body: Hip     Time: [id number] PT Time Calculation (min) (ACUTE ONLY): 38 min  Charges:  $Gait Training: 23-37 mins $Therapeutic Activity: 8-22 mins                    G Codes:       Tyberius Ryner H. Manson PasseyBrown, PT, DPT, NCS 10/29/16, 1:51 PM [phone]

## 2016-10-29 NOTE — Progress Notes (Addendum)
Patient is A&O x4, up to chair this shift. Up to [facility] with assist x1 and [employer]. Poor appetite. Trending BPs,  Oral pain meds decreases BP, held oral BP meds per Dr. Marry Guan. Bed alarm on for safety. Daughter at bedside. Lovenox teaching and kit reviewed. On RA, No BM. Using IS, up to 1800.

## 2017-03-07 DIAGNOSIS — M5416 Radiculopathy, lumbar region: Secondary | ICD-10-CM | POA: Insufficient documentation

## 2017-03-07 DIAGNOSIS — M8580 Other specified disorders of bone density and structure, unspecified site: Secondary | ICD-10-CM | POA: Insufficient documentation

## 2017-03-29 ENCOUNTER — Ambulatory Visit (INDEPENDENT_AMBULATORY_CARE_PROVIDER_SITE_OTHER): Payer: Medicare Other | Admitting: Urology

## 2017-03-29 ENCOUNTER — Encounter: Payer: Self-pay | Admitting: Urology

## 2017-03-29 VITALS — BP 122/72 | HR 81 | Ht 63.0 in | Wt 165.0 lb

## 2017-03-29 DIAGNOSIS — N393 Stress incontinence (female) (male): Secondary | ICD-10-CM

## 2017-03-29 DIAGNOSIS — R351 Nocturia: Secondary | ICD-10-CM | POA: Diagnosis not present

## 2017-03-29 DIAGNOSIS — N3941 Urge incontinence: Secondary | ICD-10-CM | POA: Diagnosis not present

## 2017-03-29 LAB — URINALYSIS, COMPLETE
BILIRUBIN UA: NEGATIVE
Glucose, UA: NEGATIVE
LEUKOCYTES UA: NEGATIVE
Nitrite, UA: NEGATIVE
PH UA: 5 (ref 5.0–7.5)
Protein, UA: NEGATIVE
RBC UA: NEGATIVE
Specific Gravity, UA: >1.03 — ABNORMAL HIGH (ref 1.005–1.030)
UUROB: 0.2 mg/dL (ref 0.2–1.0)

## 2017-03-29 LAB — MICROSCOPIC EXAMINATION
RBC MICROSCOPIC, UA: NONE SEEN /HPF (ref 0–?)
WBC, UA: NONE SEEN /hpf (ref 0–?)

## 2017-03-29 LAB — BLADDER SCAN AMB NON-IMAGING: SCAN RESULT: 0

## 2017-03-29 NOTE — Progress Notes (Signed)
03/29/2017 8:06 AM   Sarah Moon [birth year] [id number]  Referring provider: Leotis Shames, MD [address] [address] [facility] [place], [place] [address]  Chief Complaint  Patient presents with  . Urinary Incontinence    HPI: 83 yo F who presents today for further evaluation of long-standing urinary incontinence.  She reports that for greater than 1 year, she's had issues with urge incontinence particularly at nighttime. She wears 3 depends each night.  She gets up at least 3 times nightly to urinate. Each time, she sits up on the side of her bed for a few minutes to orient herself and wake up a little bit. When she stands, she immediately has a large volume episode of incontinence which often leaks through her depends. She is completely saturated. She reports that she doesn't have enough time to get to the bathroom. This is very bothersome to her.  The daytime, she has fewer symptoms. For the most part, she is able to get to the bathroom in time. She doesn't have significant urgency or frequency in the daytime.  She does have mild baseline stress incontinence which she's had since her hysterectomy decades ago. Is not bothersome to her.   She has tried oxybutinin 10 mg increased to 20 mg which did not help.  She did have dry eyes and dry mouth.    PVR 0   She denies any gross hematuria or UTIs. No history of microscopic hematuria.  She does have peripheral neuropathy in issues ambulating. She uses a cane to walk.  She does have bilateral lower extremity edema and occasionally wears compression stockings.  PMH: Past Medical History:  Diagnosis Date  . Anxiety   . Arthritis   . B12 deficiency   . Baker's cyst    Rt leg  . Elevated lipids   . Environmental and seasonal allergies   . GERD (gastroesophageal reflux disease)   . High cholesterol   . Hypertension   . Neuropathy     Surgical History: Past Surgical History:  Procedure Laterality Date  .  ABDOMINAL HYSTERECTOMY    . CATARACT EXTRACTION W/ INTRAOCULAR LENS IMPLANT Bilateral   . CHOLECYSTECTOMY    . HERNIA REPAIR    . TOTAL HIP ARTHROPLASTY Left 10/27/2016   Procedure: TOTAL HIP ARTHROPLASTY;  Surgeon: Donato Heinz, MD;  Location: [facility] ORS;  Service: Orthopedics;  Laterality: Left;    Home Medications:  Allergies as of 03/29/2017      Reactions   Augmentin [amoxicillin-pot Clavulanate] Nausea Only, Rash, Other (See Comments)   GI upset GI upset Has patient had a PCN reaction causing immediate rash, facial/tongue/throat swelling, SOB or lightheadedness with hypotension:No Has patient had a PCN reaction causing severe rash involving mucus membranes or skin necrosis:No Has patient had a PCN reaction that required hospitalization:No Has patient had a PCN reaction occurring within the last 10 years:Yes--upset stomach If all of the above answers are "NO", then may proceed with Cephalosporin use.   Oxycodone Other (See Comments)   Memory loss, medication is too strong.      Medication List       Accurate as of 03/29/17 11:59 PM. Always use your most recent med list.          acetaminophen 500 MG tablet Commonly known as:  TYLENOL Take 500 mg by mouth every 6 (six) hours as needed (for pain.). Gel cap   ALPRAZolam 0.25 MG tablet Commonly known as:  XANAX Take 0.25 mg by mouth 2 (  two) times daily as needed (for anxiety/sleep.).   ASPERCREME LIDOCAINE EX Apply 1 application topically 3 (three) times daily as needed (for hip pain.).   BIOFREEZE EX Apply 1 application topically 4 (four) times daily as needed (for hip pain.).   calcium carbonate 1500 (600 Ca) MG Tabs tablet Commonly known as:  OSCAL Take 600 mg by mouth 2 (two) times daily.   citalopram 20 MG tablet Commonly known as:  CELEXA Take 20 mg by mouth daily.   COMBIGAN 0.2-0.5 % ophthalmic solution Generic drug:  brimonidine-timolol Place 1 drop into both eyes every morning.   cyanocobalamin 1000  MCG/ML injection Commonly known as:  (VITAMIN B-12) Inject 1,000 mcg into the muscle every 30 (thirty) days.   diclofenac 50 MG EC tablet Commonly known as:  VOLTAREN Take 50 mg by mouth 2 (two) times daily as needed.   enoxaparin 30 MG/0.3ML injection Commonly known as:  LOVENOX Inject 0.4 mLs (40 mg total) into the skin daily.   fluticasone 50 MCG/ACT nasal spray Commonly known as:  FLONASE Place 1 spray into both nostrils daily as needed for allergies or rhinitis.   hydroxypropyl methylcellulose / hypromellose 2.5 % ophthalmic solution Commonly known as:  ISOPTO TEARS / GONIOVISC Place 1 drop into both eyes 3 (three) times daily as needed (for dry/allergy eyes.).   levocetirizine 5 MG tablet Commonly known as:  XYZAL Take 5 mg by mouth at bedtime as needed for allergies.   LUMIGAN 0.01 % Soln Generic drug:  bimatoprost Place 1 drop into both eyes at bedtime.   oxybutynin 10 MG 24 hr tablet Commonly known as:  DITROPAN-XL Take 10 mg by mouth at bedtime.   oxyCODONE 5 MG immediate release tablet Commonly known as:  Oxy IR/ROXICODONE Take 1-2 tablets (5-10 mg total) by mouth every 4 (four) hours as needed for severe pain.   pravastatin 20 MG tablet Commonly known as:  PRAVACHOL Take 20 mg by mouth at bedtime.   quinapril-hydrochlorothiazide 20-12.5 MG tablet Commonly known as:  ACCURETIC Take 1 tablet by mouth daily.   ranitidine 150 MG tablet Commonly known as:  ZANTAC Take 150 mg by mouth 2 (two) times daily as needed (for acid reflux.).   traMADol 50 MG tablet Commonly known as:  ULTRAM Take 1-2 tablets (50-100 mg total) by mouth every 4 (four) hours as needed for moderate pain.       Allergies:  Allergies  Allergen Reactions  . Augmentin [Amoxicillin-Pot Clavulanate] Nausea Only, Rash and Other (See Comments)    GI upset GI upset Has patient had a PCN reaction causing immediate rash, facial/tongue/throat swelling, SOB or lightheadedness with  hypotension:No Has patient had a PCN reaction causing severe rash involving mucus membranes or skin necrosis:No Has patient had a PCN reaction that required hospitalization:No Has patient had a PCN reaction occurring within the last 10 years:Yes--upset stomach If all of the above answers are "NO", then may proceed with Cephalosporin use.   Marland Kitchen Oxycodone Other (See Comments)    Memory loss, medication is too strong.    Family History: No family history on file.  Social History:  reports that she has never smoked. She has never used smokeless tobacco. She reports that she does not drink alcohol or use drugs.  ROS: UROLOGY Frequent Urination?: Yes Hard to postpone urination?: No Burning/pain with urination?: No Get up at night to urinate?: Yes Leakage of urine?: Yes Urine stream starts and stops?: No Trouble starting stream?: No Do you have to strain to  urinate?: No Blood in urine?: No Urinary tract infection?: No Sexually transmitted disease?: No Injury to kidneys or bladder?: No Painful intercourse?: No Weak stream?: No Currently pregnant?: No Vaginal bleeding?: No Last menstrual period?: n  Gastrointestinal Nausea?: Yes Vomiting?: No Indigestion/heartburn?: Yes Diarrhea?: No Constipation?: Yes  Constitutional Fever: No Night sweats?: No Weight loss?: Yes Fatigue?: Yes  Skin Skin rash/lesions?: No Itching?: Yes  Eyes Blurred vision?: No Double vision?: No  Ears/Nose/Throat Sore throat?: No Sinus problems?: Yes  Hematologic/Lymphatic Swollen glands?: No Easy bruising?: Yes  Cardiovascular Leg swelling?: Yes Chest pain?: No  Respiratory Cough?: No Shortness of breath?: No  Endocrine Excessive thirst?: No  Musculoskeletal Back pain?: Yes Joint pain?: Yes  Neurological Headaches?: No Dizziness?: Yes  Psychologic Depression?: Yes Anxiety?: Yes  Physical Exam: BP 122/72 (BP Location: Right Arm, Patient Position: Sitting, Cuff Size:  Normal)   Pulse 81   Ht  (1.6 m)   Wt 165 lb (74.8 kg)   LMP  (LMP Unknown)   BMI 29.23 kg/m   Constitutional:  Alert and oriented, No acute distress.  By daughter today.  Ambulating slowly with cane. HEENT: [place] AT, moist mucus membranes.  Trachea midline, no masses. Cardiovascular: Mild 1+ lower extremity edema. Respiratory: Normal respiratory effort, no increased work of breathing. GI: Abdomen is soft, nontender, nondistended, no abdominal masses GU: No CVA tenderness Skin: No rashes, bruises or suspicious lesions. Neurologic: Grossly intact, no focal deficits, moving all 4 extremities. Psychiatric: Normal mood and affect.  Laboratory Data: Lab Results  Component Value Date   WBC 14.8 (H) 10/29/2016   HGB 10.1 (L) 10/29/2016   HCT 30.1 (L) 10/29/2016   MCV 91.0 10/29/2016   PLT 195 10/29/2016    Lab Results  Component Value Date   CREATININE 1.17 (H) 10/29/2016    Urinalysis Lab Results  Component Value Date   SPECGRAV >1.030 (H) 03/29/2017   PHUR 5.0 03/29/2017   COLORU Yellow 03/29/2017   APPEARANCEUR Clear 03/29/2017   LEUKOCYTESUR Negative 03/29/2017   PROTEINUR Negative 03/29/2017   GLUCOSEU Negative 03/29/2017   KETONESU 1+ (A) 03/29/2017   RBCU Negative 03/29/2017   BILIRUBINUR Negative 03/29/2017   UUROB 0.2 03/29/2017   NITRITE Negative 03/29/2017    Lab Results  Component Value Date   LABMICR See below: 03/29/2017   WBCUA None seen 03/29/2017   RBCUA None seen 03/29/2017   LABEPIT 0-10 03/29/2017   MUCUS Present (A) 03/29/2017   BACTERIA Moderate (A) 03/29/2017    Pertinent Imaging: Bladder Scan (Post Void Residual) in office  Result Value Ref Range   Scan Result 0     Assessment & Plan: 83 year old with long-standing severe urge incontinence (mild SUI).  Risk factors for urge incontinence include mobility issues, or physiologic nocturnal diuresis, history of neuropathy amongst others.  No evidence of UTI or overflow incontinence is  contributing factors.  1. Urge incontinence Previously failed oxybutynin Given her age, would like to avoid anticholinergics Trial of Myrbetriq 25 mg x 4 weeks given, advised to call to potentially optimize dose if needed Side effects of medication reviewed Discussed options for refractory OAB if fails the above In addition to medical therapy, discussed behavioral therapy - Urinalysis, Complete - Bladder Scan (Post Void Residual) in office  2. Nocturia Likely physiologic Advised to keep feet elevated and wear compression hose to decrease the amount of fluid redistribution at night Avoid beverages just before bed  3. Stress incontinence, female Mild stress incontinence, advised Kegel's   Return in  about 4 weeks (around 04/26/2017).  Vanna Scotland, MD  [facility] [address], [address] [place], [place] [address] [phone]

## 2017-04-26 ENCOUNTER — Encounter: Payer: Self-pay | Admitting: Urology

## 2017-04-26 ENCOUNTER — Ambulatory Visit (INDEPENDENT_AMBULATORY_CARE_PROVIDER_SITE_OTHER): Payer: Medicare Other | Admitting: Urology

## 2017-04-26 VITALS — BP 143/79 | HR 105 | Ht 63.0 in | Wt 163.6 lb

## 2017-04-26 DIAGNOSIS — R351 Nocturia: Secondary | ICD-10-CM

## 2017-04-26 DIAGNOSIS — N3941 Urge incontinence: Secondary | ICD-10-CM

## 2017-04-26 DIAGNOSIS — N393 Stress incontinence (female) (male): Secondary | ICD-10-CM | POA: Diagnosis not present

## 2017-04-26 LAB — BLADDER SCAN AMB NON-IMAGING: Scan Result: 0

## 2017-04-26 MED ORDER — MIRABEGRON ER 25 MG PO TB24: 25.0000 mg | ORAL_TABLET | Freq: Every day | ORAL | 11 refills | Status: DC

## 2017-04-26 NOTE — Progress Notes (Signed)
04/26/2017 3:31 PM   Sarah BeckwithMildred L Poet 11/21/1925 [id number]  Referring provider: Leotis ShamesSingh, Jasmine, MD [address] [facility] [place] [facility] [place] [id number]  Chief Complaint  Patient presents with  . Urinary Incontinence    HPI: 83 yo F who presents with urge incontinence who returns today for routine follow-up.  She is tried 4 weeks of Myrbetriq and is done very well with this medication.  Her daytime frequency/urgency is improved but primarily, her nocturia has completely resolved.  She no longer gets up at night to void.  Reports feeling refreshed and that she is finally able to rest.  She is pleased.  She does note today that she is developed what feels like a mild sinus headache every morning with a runny nose since starting the patient.  This has persisted and failed to resolve.  She is worried that this is related to the medication.  She cannot be certain though because she does have underlying sinus issues.  She does have mild baseline stress incontinence which she's had since her hysterectomy decades ago.   She has tried oxybutinin 10 mg increased to 20 mg which did not help.  She did have dry eyes and dry mouth.    PVR 0    PMH: Past Medical History:  Diagnosis Date  . Anxiety   . Arthritis   . B12 deficiency   . Baker's cyst    Rt leg  . Elevated lipids   . Environmental and seasonal allergies   . GERD (gastroesophageal reflux disease)   . High cholesterol   . Hypertension   . Neuropathy     Surgical History: Past Surgical History:  Procedure Laterality Date  . ABDOMINAL HYSTERECTOMY    . CATARACT EXTRACTION W/ INTRAOCULAR LENS IMPLANT Bilateral   . CHOLECYSTECTOMY    . HERNIA REPAIR      Home Medications:  Allergies as of 04/26/2017      Reactions   Augmentin [amoxicillin-pot Clavulanate] Nausea Only, Rash, Other (See Comments)   GI upset GI upset Has patient had a PCN reaction causing immediate rash, facial/tongue/throat swelling, SOB  or lightheadedness with hypotension:No Has patient had a PCN reaction causing severe rash involving mucus membranes or skin necrosis:No Has patient had a PCN reaction that required hospitalization:No Has patient had a PCN reaction occurring within the last 10 years:Yes--upset stomach If all of the above answers are "NO", then may proceed with Cephalosporin use.   Oxycodone Other (See Comments)   Memory loss, medication is too strong.      Medication List        Accurate as of 04/26/17  3:31 PM. Always use your most recent med list.          acetaminophen 500 MG tablet Commonly known as:  TYLENOL Take 500 mg by mouth every 6 (six) hours as needed (for pain.). Gel cap   ALPRAZolam 0.25 MG tablet Commonly known as:  XANAX Take 0.25 mg by mouth 2 (two) times daily as needed (for anxiety/sleep.).   ASPERCREME LIDOCAINE EX Apply 1 application topically 3 (three) times daily as needed (for hip pain.).   BIOFREEZE EX Apply 1 application topically 4 (four) times daily as needed (for hip pain.).   calcium carbonate 1500 (600 Ca) MG Tabs tablet Commonly known as:  OSCAL Take 600 mg by mouth 2 (two) times daily.   citalopram 20 MG tablet Commonly known as:  CELEXA Take 20 mg by mouth daily.   COMBIGAN 0.2-0.5 % ophthalmic  solution Generic drug:  brimonidine-timolol Place 1 drop into both eyes every morning.   cyanocobalamin 1000 MCG/ML injection Commonly known as:  (VITAMIN B-12) Inject 1,000 mcg into the muscle every 30 (thirty) days.   diclofenac 50 MG EC tablet Commonly known as:  VOLTAREN Take 50 mg by mouth 2 (two) times daily as needed.   enoxaparin 30 MG/0.3ML injection Commonly known as:  LOVENOX Inject 0.4 mLs (40 mg total) into the skin daily.   fluticasone 50 MCG/ACT nasal spray Commonly known as:  FLONASE Place 1 spray into both nostrils daily as needed for allergies or rhinitis.   hydroxypropyl methylcellulose / hypromellose 2.5 % ophthalmic  solution Commonly known as:  ISOPTO TEARS / GONIOVISC Place 1 drop into both eyes 3 (three) times daily as needed (for dry/allergy eyes.).   levocetirizine 5 MG tablet Commonly known as:  XYZAL Take 5 mg by mouth at bedtime as needed for allergies.   LUMIGAN 0.01 % Soln Generic drug:  bimatoprost Place 1 drop into both eyes at bedtime.   mirabegron ER 25 MG Tb24 tablet Commonly known as:  MYRBETRIQ Take 1 tablet (25 mg total) daily by mouth.   oxyCODONE 5 MG immediate release tablet Commonly known as:  Oxy IR/ROXICODONE Take 1-2 tablets (5-10 mg total) by mouth every 4 (four) hours as needed for severe pain.   pravastatin 20 MG tablet Commonly known as:  PRAVACHOL Take 20 mg by mouth at bedtime.   quinapril-hydrochlorothiazide 20-12.5 MG tablet Commonly known as:  ACCURETIC Take 1 tablet by mouth daily.   ranitidine 150 MG tablet Commonly known as:  ZANTAC Take 150 mg by mouth 2 (two) times daily as needed (for acid reflux.).   traMADol 50 MG tablet Commonly known as:  ULTRAM Take 1-2 tablets (50-100 mg total) by mouth every 4 (four) hours as needed for moderate pain.       Allergies:  Allergies  Allergen Reactions  . Augmentin [Amoxicillin-Pot Clavulanate] Nausea Only, Rash and Other (See Comments)    GI upset GI upset Has patient had a PCN reaction causing immediate rash, facial/tongue/throat swelling, SOB or lightheadedness with hypotension:No Has patient had a PCN reaction causing severe rash involving mucus membranes or skin necrosis:No Has patient had a PCN reaction that required hospitalization:No Has patient had a PCN reaction occurring within the last 10 years:Yes--upset stomach If all of the above answers are "NO", then may proceed with Cephalosporin use.   Marland Kitchen Oxycodone Other (See Comments)    Memory loss, medication is too strong.    Family History: History reviewed. No pertinent family history.  Social History:  reports that  has never smoked. she  has never used smokeless tobacco. She reports that she does not drink alcohol or use drugs.  ROS: UROLOGY Frequent Urination?: Yes Hard to postpone urination?: No Burning/pain with urination?: No Get up at night to urinate?: Yes Leakage of urine?: Yes Urine stream starts and stops?: No Trouble starting stream?: No Do you have to strain to urinate?: No Blood in urine?: No Urinary tract infection?: No Sexually transmitted disease?: No Injury to kidneys or bladder?: No Painful intercourse?: No Weak stream?: No Currently pregnant?: No Vaginal bleeding?: No Last menstrual period?: n  Gastrointestinal Nausea?: Yes Vomiting?: No Indigestion/heartburn?: No Diarrhea?: No Constipation?: No  Constitutional Fever: No Night sweats?: No Weight loss?: Yes Fatigue?: Yes  Skin Skin rash/lesions?: No Itching?: No  Eyes Blurred vision?: No Double vision?: No  Ears/Nose/Throat Sore throat?: No Sinus problems?: Yes  Hematologic/Lymphatic Swollen  glands?: No Easy bruising?: Yes  Cardiovascular Leg swelling?: No Chest pain?: No  Respiratory Cough?: Yes Shortness of breath?: No  Endocrine Excessive thirst?: No  Musculoskeletal Back pain?: Yes Joint pain?: No  Neurological Headaches?: Yes Dizziness?: Yes  Psychologic Depression?: No Anxiety?: Yes  Physical Exam: BP (!) 143/79 (BP Location: Right Arm, Patient Position: Sitting, Cuff Size: Normal)   Pulse (!) 105   Ht 5\' 3"  (1.6 m)   Wt 163 lb 9.6 oz (74.2 kg)   LMP  (LMP Unknown)   BMI 28.98 kg/m   Constitutional:  Alert and oriented, No acute distress.  Accompanied by son today..  Ambulating slowly with cane. HEENT: [place] AT, moist mucus membranes.  Trachea midline, no masses. Respiratory: Normal respiratory effort, no increased work of breathing. Skin: No rashes, bruises or suspicious lesions. Neurologic: Grossly intact, no focal deficits, moving all 4 extremities. Psychiatric: Normal mood and  affect.  Laboratory Data: Lab Results  Component Value Date   WBC 14.8 (H) 10/29/2016   HGB 10.1 (L) 10/29/2016   HCT 30.1 (L) 10/29/2016   MCV 91.0 10/29/2016   PLT 195 10/29/2016    Lab Results  Component Value Date   CREATININE 1.17 (H) 10/29/2016    Urinalysis N/a  Pertinent Imaging: Results for orders placed or performed in visit on 04/26/17  Bladder Scan (Post Void Residual) in office  Result Value Ref Range   Scan Result 0     Assessment & Plan:   1. Urge incontinence Previously failed oxybutynin Given her age, would like to avoid anticholinergics Doing well now on Myrbetriq 25 mg, will write a prescription for this medication Unclear if stuffy nose related to medications or chronic sinusitis If her stuffiness/headaches continue, recommend 2-week hiatus from medication then resume the medication and see if there is an interval improvement and then worsening again to test her hypothesis that is related to the medication Follow-up as needed  - Bladder Scan (Post Void Residual) in office  2. Nocturia Multifactorial Improved with Myrbetriq as above  3. Stress incontinence, female Mild stress incontinence, advised Kegel's   Return if symptoms worsen or fail to improve.  Vanna ScotlandAshley Lukus Binion, MD  [facility] [address], [address] [place], [place] [address] [phone]

## 2017-07-25 DIAGNOSIS — J3089 Other allergic rhinitis: Secondary | ICD-10-CM | POA: Insufficient documentation

## 2017-07-25 DIAGNOSIS — R42 Dizziness and giddiness: Secondary | ICD-10-CM | POA: Insufficient documentation

## 2017-11-24 ENCOUNTER — Ambulatory Visit: Payer: Medicare Other | Admitting: Urology

## 2018-05-08 ENCOUNTER — Other Ambulatory Visit
Admission: RE | Admit: 2018-05-08 | Discharge: 2018-05-08 | Disposition: A | Discharge status: Home / Self Care | Payer: Medicare Other | Source: Ambulatory Visit | Attending: Internal Medicine | Admitting: Internal Medicine

## 2018-05-08 DIAGNOSIS — R110 Nausea: Secondary | ICD-10-CM | POA: Insufficient documentation

## 2018-05-08 DIAGNOSIS — R61 Generalized hyperhidrosis: Secondary | ICD-10-CM | POA: Insufficient documentation

## 2018-05-08 LAB — TROPONIN I: Troponin I: <0.03 ng/mL (ref <0.03)

## 2018-11-15 ENCOUNTER — Other Ambulatory Visit: Payer: Self-pay | Admitting: Internal Medicine

## 2018-11-15 DIAGNOSIS — R270 Ataxia, unspecified: Secondary | ICD-10-CM

## 2018-11-16 ENCOUNTER — Other Ambulatory Visit
Admission: RE | Admit: 2018-11-16 | Discharge: 2018-11-16 | Disposition: A | Discharge status: Home / Self Care | Payer: Medicare Other | Source: Ambulatory Visit | Attending: Ophthalmology | Admitting: Ophthalmology

## 2018-11-16 DIAGNOSIS — M316 Other giant cell arteritis: Secondary | ICD-10-CM | POA: Insufficient documentation

## 2018-11-16 LAB — CBC WITH DIFFERENTIAL/PLATELET
Abs Immature Granulocytes: 0.03 10*3/uL (ref 0.00–0.07)
Basophils Absolute: 0.1 10*3/uL (ref 0.0–0.1)
Basophils Relative: 1 %
Eosinophils Absolute: 0.2 10*3/uL (ref 0.0–0.5)
Eosinophils Relative: 2 %
HCT: 40.6 % (ref 36.0–46.0)
Hemoglobin: 13.1 g/dL (ref 12.0–15.0)
Immature Granulocytes: 0 %
Lymphocytes Relative: 35 %
Lymphs Abs: 3.3 10*3/uL (ref 0.7–4.0)
MCH: 30.8 pg (ref 26.0–34.0)
MCHC: 32.3 g/dL (ref 30.0–36.0)
MCV: 95.5 fL (ref 80.0–100.0)
Monocytes Absolute: 0.7 10*3/uL (ref 0.1–1.0)
Monocytes Relative: 7 %
Neutro Abs: 5.1 10*3/uL (ref 1.7–7.7)
Neutrophils Relative %: 55 %
Platelets: 262 10*3/uL (ref 150–400)
RBC: 4.25 MIL/uL (ref 3.87–5.11)
RDW: 12.5 % (ref 11.5–15.5)
WBC: 9.4 10*3/uL (ref 4.0–10.5)
nRBC: 0 % (ref 0.0–0.2)

## 2018-11-16 LAB — SEDIMENTATION RATE: Sed Rate: 9 mm/hr (ref 0–30)

## 2018-11-16 LAB — C-REACTIVE PROTEIN: CRP: <0.8 mg/dL (ref <1.0)

## 2018-11-23 ENCOUNTER — Other Ambulatory Visit: Payer: Self-pay

## 2018-11-23 ENCOUNTER — Ambulatory Visit
Admission: RE | Admit: 2018-11-23 | Discharge: 2018-11-23 | Disposition: A | Discharge status: Home / Self Care | Payer: Medicare Other | Source: Ambulatory Visit | Attending: Internal Medicine | Admitting: Internal Medicine

## 2018-11-23 DIAGNOSIS — R270 Ataxia, unspecified: Secondary | ICD-10-CM | POA: Diagnosis present

## 2018-11-28 ENCOUNTER — Other Ambulatory Visit (HOSPITAL_COMMUNITY): Payer: Self-pay | Admitting: Internal Medicine

## 2018-11-28 ENCOUNTER — Other Ambulatory Visit: Payer: Self-pay | Admitting: Internal Medicine

## 2018-11-28 DIAGNOSIS — R4182 Altered mental status, unspecified: Secondary | ICD-10-CM

## 2018-12-05 ENCOUNTER — Ambulatory Visit
Admission: RE | Admit: 2018-12-05 | Discharge: 2018-12-05 | Disposition: A | Discharge status: Home / Self Care | Payer: Medicare Other | Source: Ambulatory Visit | Attending: Internal Medicine | Admitting: Internal Medicine

## 2018-12-05 ENCOUNTER — Other Ambulatory Visit: Payer: Self-pay

## 2018-12-05 DIAGNOSIS — R4182 Altered mental status, unspecified: Secondary | ICD-10-CM | POA: Insufficient documentation

## 2018-12-08 ENCOUNTER — Ambulatory Visit: Payer: Medicare Other

## 2019-07-31 DIAGNOSIS — G4733 Obstructive sleep apnea (adult) (pediatric): Secondary | ICD-10-CM | POA: Insufficient documentation

## 2019-12-22 IMAGING — MR MRI HEAD WITHOUT CONTRAST
11 series · 42 of 48 positions shown · non-contrast
Comparison: 11/23/2018 head CT

Brain MRI 07/21/2013

CLINICAL DATA: Headache, confusion and balance loss.

EXAM:
MRI HEAD WITHOUT CONTRAST
TECHNIQUE: Multiplanar, multiecho pulse sequences of the brain and surrounding
structures were obtained without intravenous contrast.

[Series 5: ax dwi_tracew · axial · 3.0mm · 0.60mm/px · z∈[-89,+71]mm · 5 of 55 slices shown]
[im 1/55]
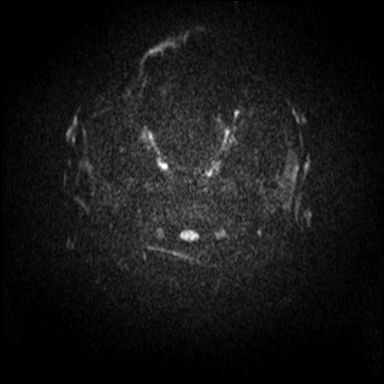
[im 14/55]
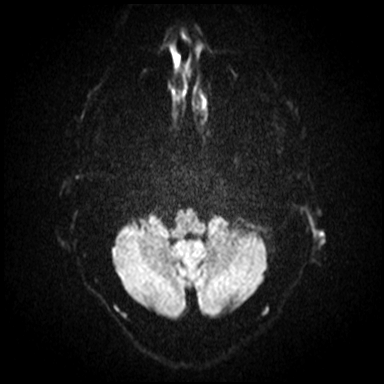
[im 28/55]
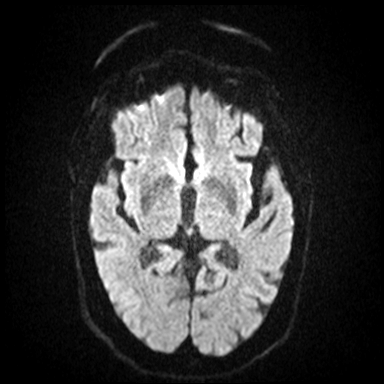
[im 41/55]
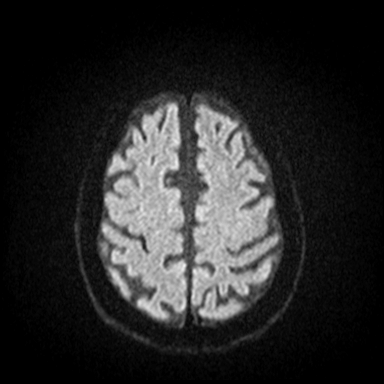
[im 55/55]
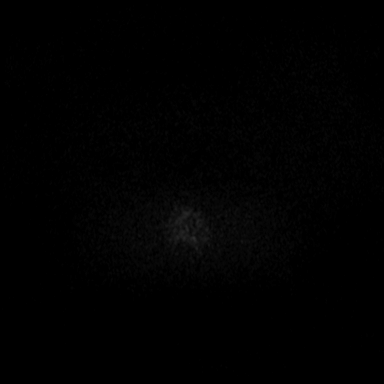

[Series 6: ax dwi_adc · axial · 3.0mm · 0.60mm/px · z∈[-89,+71]mm · 5 of 55 slices shown]
[im 1/55]
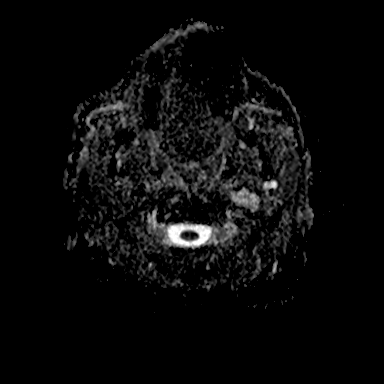
[im 14/55]
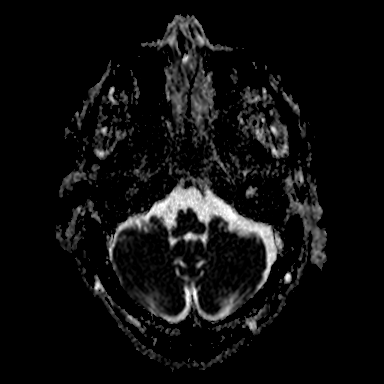
[im 28/55]
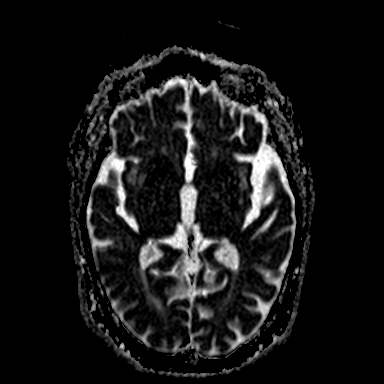
[im 41/55]
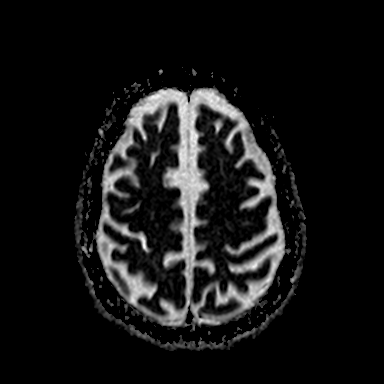
[im 55/55]
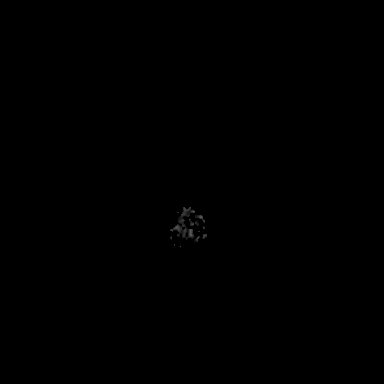

[Series 7: cor dwi_tracew · coronal · 5.0mm · 0.60mm/px · 3 of 38 slices shown]
[im 1/38]
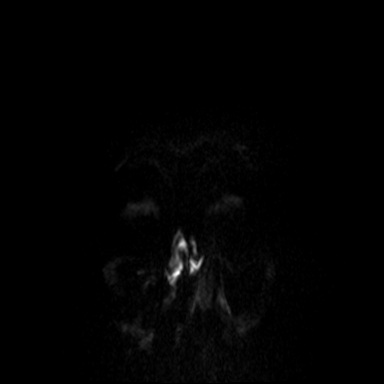
[im 19/38]
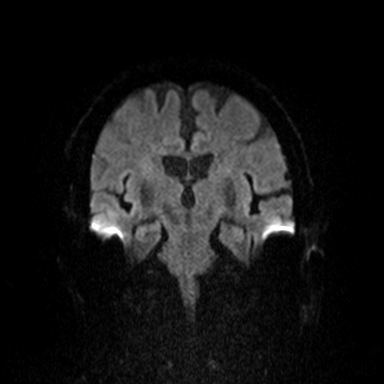
[im 38/38]
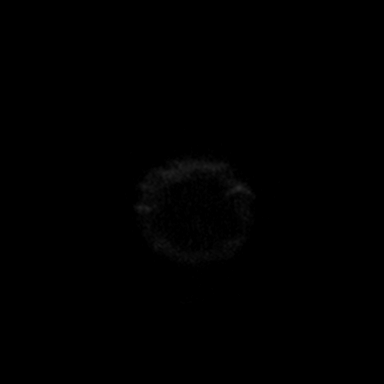

[Series 8: cor dwi_adc · coronal · 5.0mm · 0.60mm/px · 3 of 38 slices shown]
[im 1/38]
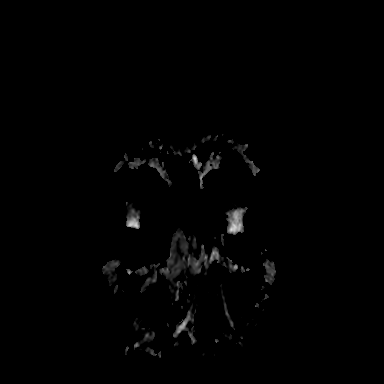
[im 19/38]
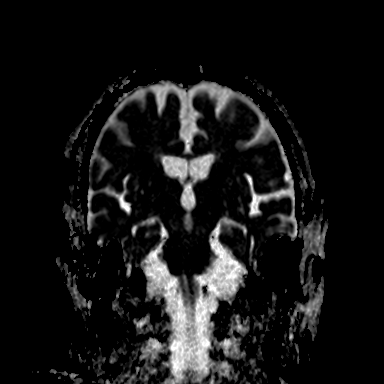
[im 38/38]
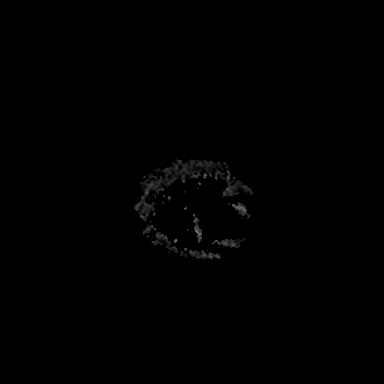

[Series 9: T1 · sagittal · 5.0mm · 0.62mm/px · 2 of 21 slices shown (1 of 2)]
[im 1/21]
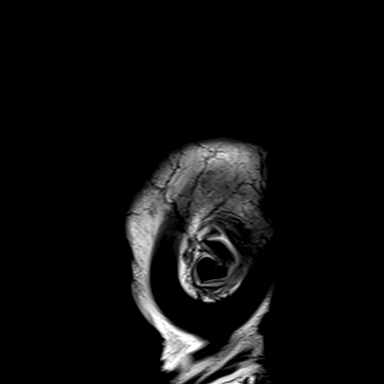
[im 21/21]
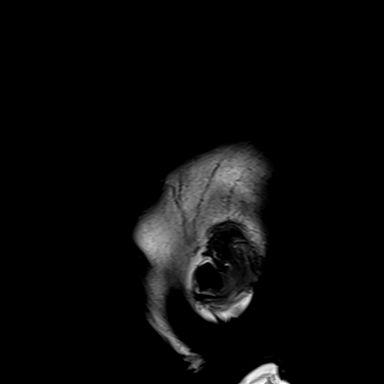

[Series 10: T2 · axial · 5.0mm · 0.53mm/px · z∈[-84,+70]mm · 2 of 27 slices shown (1 of 2)]
[im 1/27]
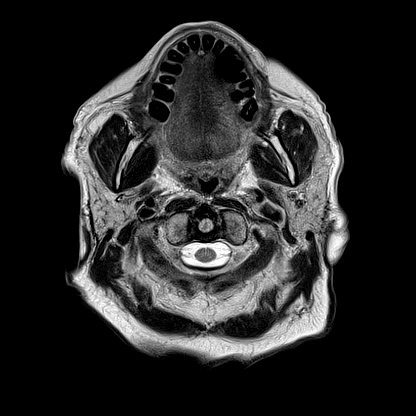
[im 27/27]
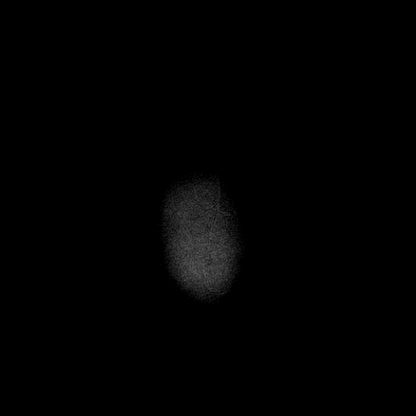

[Series 12: pha_images · axial · 3.0mm · 0.90mm/px · z∈[-96,+64]mm · 4 of 55 slices shown]
[im 1/55]
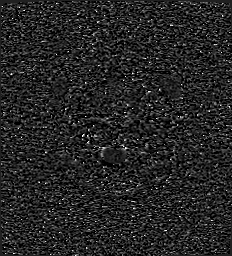
[im 19/55]
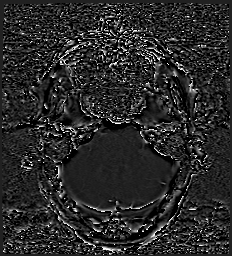
[im 37/55]
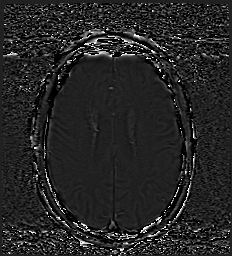
[im 55/55]
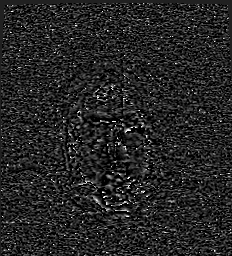

[Series 13: swi_images · axial · 3.0mm · 0.90mm/px · z∈[-96,+35]mm · 4 of 60 slices shown]
[im 1/60]
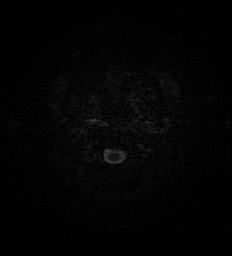
[im 15/60]
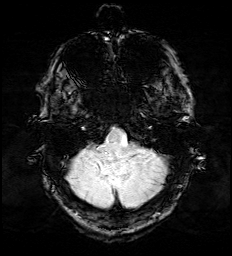
[im 30/60]
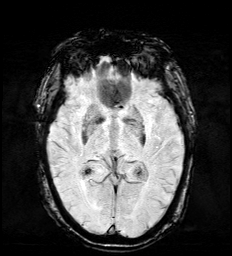
[im 45/60]
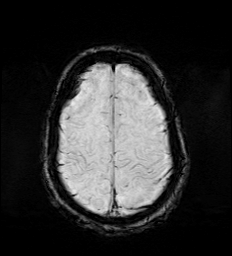

[Series 15: FLAIR · axial · 3.0mm · 0.53mm/px · z∈[-87,+72]mm · 4 of 55 slices shown]
[im 1/55]
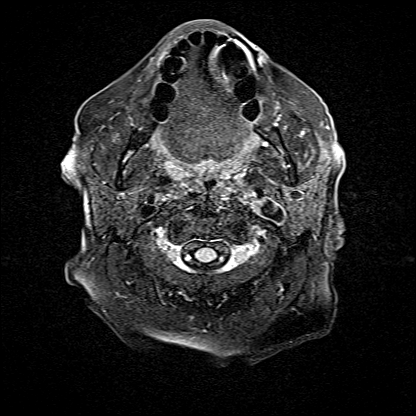
[im 19/55]
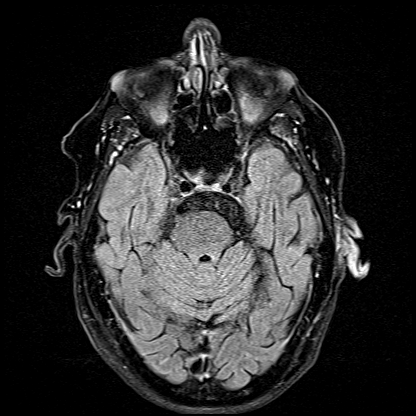
[im 37/55]
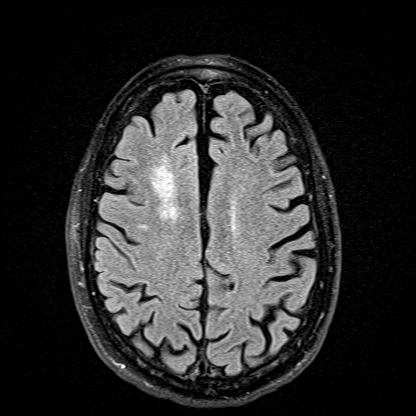
[im 55/55]
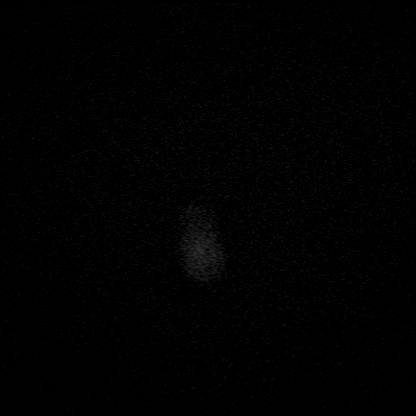

[Series 16: T1 · axial · 1.0mm · 0.98mm/px · z∈[-88,+69]mm · 8 of 160 slices shown (2 of 2)]
[im 1/160]
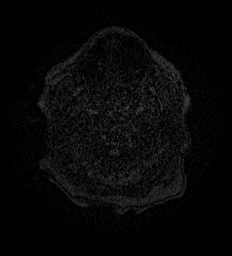
[im 27/160]
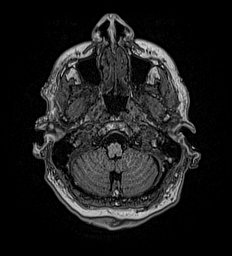
[im 54/160]
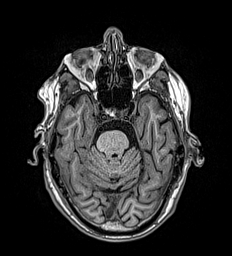
[im 67/160]
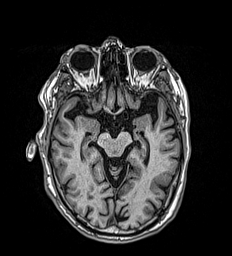
[im 93/160]
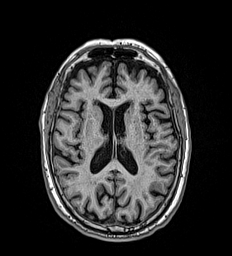
[im 107/160]
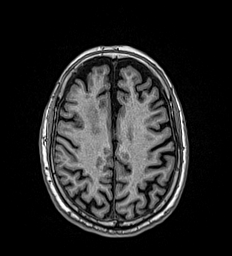
[im 133/160]
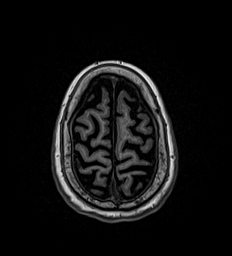
[im 160/160]
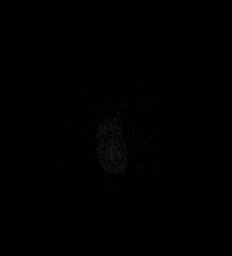

[Series 17: T2 · coronal · 5.0mm · 0.57mm/px · 2 of 29 slices shown (2 of 2)]
[im 1/29]
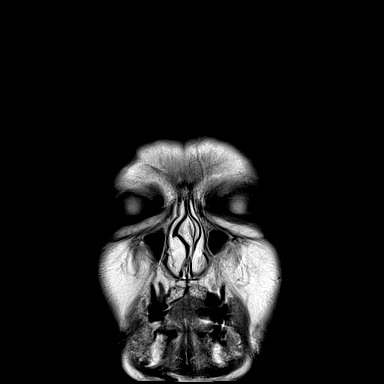
[im 29/29]
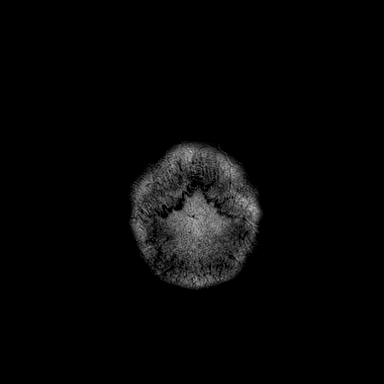

[42 of 48 positions shown; findings below may reference images not displayed]

FINDINGS: BRAIN: There is no acute infarct, acute hemorrhage or extra-axial
collection. The midline structures are normal. Multifocal white
matter hyperintensity, most commonly due to chronic ischemic
microangiopathy. The cerebral and cerebellar volume are
age-appropriate. No hydrocephalus. Susceptibility-sensitive
sequences show no chronic microhemorrhage or superficial siderosis.
No midline shift or other mass effect.

VASCULAR: The major intracranial arterial and venous sinus flow
voids are normal.

SKULL AND UPPER CERVICAL SPINE: Calvarial bone marrow signal is
normal. There is no skull base mass. Visualized upper cervical spine
and soft tissues are normal.

SINUSES/ORBITS: No fluid levels or advanced mucosal thickening. No
mastoid or middle ear effusion. The orbits are normal.
IMPRESSION: Mild chronic small vessel disease, slightly progressed compared to
07/21/2013.

## 2020-01-21 ENCOUNTER — Other Ambulatory Visit: Payer: Self-pay | Admitting: Neurology

## 2020-01-21 DIAGNOSIS — R410 Disorientation, unspecified: Secondary | ICD-10-CM

## 2020-01-23 ENCOUNTER — Other Ambulatory Visit: Payer: Self-pay

## 2020-01-23 ENCOUNTER — Ambulatory Visit
Admission: RE | Admit: 2020-01-23 | Discharge: 2020-01-23 | Disposition: A | Discharge status: Home / Self Care | Payer: Medicare Other | Source: Ambulatory Visit | Attending: Neurology | Admitting: Neurology

## 2020-01-23 DIAGNOSIS — R410 Disorientation, unspecified: Secondary | ICD-10-CM | POA: Insufficient documentation

## 2020-03-23 ENCOUNTER — Encounter: Payer: Self-pay | Admitting: Emergency Medicine

## 2020-03-23 ENCOUNTER — Other Ambulatory Visit: Payer: Self-pay

## 2020-03-23 DIAGNOSIS — I129 Hypertensive chronic kidney disease with stage 1 through stage 4 chronic kidney disease, or unspecified chronic kidney disease: Secondary | ICD-10-CM | POA: Insufficient documentation

## 2020-03-23 DIAGNOSIS — N1831 Chronic kidney disease, stage 3a: Secondary | ICD-10-CM | POA: Insufficient documentation

## 2020-03-23 DIAGNOSIS — Z79899 Other long term (current) drug therapy: Secondary | ICD-10-CM | POA: Insufficient documentation

## 2020-03-23 DIAGNOSIS — F0390 Unspecified dementia without behavioral disturbance: Secondary | ICD-10-CM | POA: Diagnosis not present

## 2020-03-23 DIAGNOSIS — R42 Dizziness and giddiness: Principal | ICD-10-CM | POA: Insufficient documentation

## 2020-03-23 DIAGNOSIS — Z20822 Contact with and (suspected) exposure to covid-19: Secondary | ICD-10-CM | POA: Diagnosis not present

## 2020-03-23 DIAGNOSIS — Z96642 Presence of left artificial hip joint: Secondary | ICD-10-CM | POA: Diagnosis not present

## 2020-03-23 LAB — CBC
HCT: 39.5 % (ref 36.0–46.0)
Hemoglobin: 12.9 g/dL (ref 12.0–15.0)
MCH: 31.2 pg (ref 26.0–34.0)
MCHC: 32.7 g/dL (ref 30.0–36.0)
MCV: 95.4 fL (ref 80.0–100.0)
Platelets: 235 10*3/uL (ref 150–400)
RBC: 4.14 MIL/uL (ref 3.87–5.11)
RDW: 13 % (ref 11.5–15.5)
WBC: 9.2 10*3/uL (ref 4.0–10.5)
nRBC: 0 % (ref 0.0–0.2)

## 2020-03-23 LAB — BASIC METABOLIC PANEL
Anion gap: 8 (ref 5–15)
BUN: 25 mg/dL — ABNORMAL HIGH (ref 8–23)
CO2: 25 mmol/L (ref 22–32)
Calcium: 10.1 mg/dL (ref 8.9–10.3)
Chloride: 108 mmol/L (ref 98–111)
Creatinine, Ser: 1.27 mg/dL — ABNORMAL HIGH (ref 0.44–1.00)
GFR, Estimated: 37 mL/min — ABNORMAL LOW (ref >60)
Glucose, Bld: 143 mg/dL — ABNORMAL HIGH (ref 70–99)
Potassium: 4 mmol/L (ref 3.5–5.1)
Sodium: 141 mmol/L (ref 135–145)

## 2020-03-23 NOTE — ED Triage Notes (Signed)
Patient brought in by ems from home. Patient states that she was getting ready for bed and got dizzy and fell. Per ems the patient was on the floor for about 45 minutes. Patient states that she hit her left cheek on a table. Patient denies LOC.

## 2020-03-24 ENCOUNTER — Observation Stay: Payer: Medicare Other

## 2020-03-24 ENCOUNTER — Emergency Department: Payer: Medicare Other

## 2020-03-24 ENCOUNTER — Observation Stay
Admission: EM | Admit: 2020-03-24 | Discharge: 2020-03-25 | Disposition: A | Discharge status: Home / Self Care | Payer: Medicare Other | Attending: Internal Medicine | Admitting: Internal Medicine

## 2020-03-24 ENCOUNTER — Encounter: Payer: Self-pay | Admitting: Radiology

## 2020-03-24 DIAGNOSIS — Y92009 Unspecified place in unspecified non-institutional (private) residence as the place of occurrence of the external cause: Secondary | ICD-10-CM

## 2020-03-24 DIAGNOSIS — R42 Dizziness and giddiness: Secondary | ICD-10-CM | POA: Diagnosis not present

## 2020-03-24 DIAGNOSIS — W19XXXA Unspecified fall, initial encounter: Secondary | ICD-10-CM | POA: Diagnosis present

## 2020-03-24 DIAGNOSIS — F418 Other specified anxiety disorders: Secondary | ICD-10-CM | POA: Diagnosis present

## 2020-03-24 DIAGNOSIS — N183 Chronic kidney disease, stage 3 unspecified: Secondary | ICD-10-CM | POA: Diagnosis present

## 2020-03-24 DIAGNOSIS — R7989 Other specified abnormal findings of blood chemistry: Secondary | ICD-10-CM

## 2020-03-24 DIAGNOSIS — I10 Essential (primary) hypertension: Secondary | ICD-10-CM | POA: Diagnosis present

## 2020-03-24 DIAGNOSIS — N1831 Chronic kidney disease, stage 3a: Secondary | ICD-10-CM

## 2020-03-24 DIAGNOSIS — E7800 Pure hypercholesterolemia, unspecified: Secondary | ICD-10-CM | POA: Diagnosis present

## 2020-03-24 LAB — HEPATIC FUNCTION PANEL
ALT: 15 U/L (ref 0–44)
AST: 23 U/L (ref 15–41)
Albumin: 4.2 g/dL (ref 3.5–5.0)
Alkaline Phosphatase: 64 U/L (ref 38–126)
Bilirubin, Direct: <0.1 mg/dL (ref 0.0–0.2)
Total Bilirubin: 1 mg/dL (ref 0.3–1.2)
Total Protein: 7.3 g/dL (ref 6.5–8.1)

## 2020-03-24 LAB — URINALYSIS, COMPLETE (UACMP) WITH MICROSCOPIC
Bilirubin Urine: NEGATIVE
Glucose, UA: NEGATIVE mg/dL
Hgb urine dipstick: NEGATIVE
Ketones, ur: NEGATIVE mg/dL
Leukocytes,Ua: NEGATIVE
Nitrite: NEGATIVE
Protein, ur: NEGATIVE mg/dL
Specific Gravity, Urine: 1.025 (ref 1.005–1.030)
pH: 5 (ref 5.0–8.0)

## 2020-03-24 LAB — BRAIN NATRIURETIC PEPTIDE: B Natriuretic Peptide: 33.4 pg/mL (ref 0.0–100.0)

## 2020-03-24 LAB — CK
Total CK: 107 U/L (ref 38–234)
Total CK: 114 U/L (ref 38–234)

## 2020-03-24 LAB — VITAMIN B12: Vitamin B-12: 416 pg/mL (ref 180–914)

## 2020-03-24 LAB — RESPIRATORY PANEL BY RT PCR (FLU A&B, COVID)
Influenza A by PCR: NEGATIVE
Influenza B by PCR: NEGATIVE
SARS Coronavirus 2 by RT PCR: NEGATIVE

## 2020-03-24 LAB — TROPONIN I (HIGH SENSITIVITY)
Troponin I (High Sensitivity): 7 ng/L (ref <18)
Troponin I (High Sensitivity): 9 ng/L (ref <18)

## 2020-03-24 LAB — FIBRIN DERIVATIVES D-DIMER (ARMC ONLY): Fibrin derivatives D-dimer (ARMC): 653.24 ng/mL (FEU) — ABNORMAL HIGH (ref 0.00–499.00)

## 2020-03-24 MED ORDER — ONDANSETRON HCL 4 MG/2ML IJ SOLN: 4.0000 mg | Freq: Three times a day (TID) | INTRAMUSCULAR | Status: DC | PRN

## 2020-03-24 MED ORDER — BRIMONIDINE TARTRATE-TIMOLOL 0.2-0.5 % OP SOLN
1.0000 [drp] | Freq: Every morning | OPHTHALMIC | Status: DC
  Filled 2020-03-24: qty 5

## 2020-03-24 MED ORDER — HYPROMELLOSE (GONIOSCOPIC) 2.5 % OP SOLN
1.0000 [drp] | Freq: Three times a day (TID) | OPHTHALMIC | Status: DC | PRN
  Filled 2020-03-24: qty 15

## 2020-03-24 MED ORDER — ENOXAPARIN SODIUM 30 MG/0.3ML ~~LOC~~ SOLN
30.0000 mg | SUBCUTANEOUS | Status: DC
  Administered 2020-03-24: 30 mg via SUBCUTANEOUS
  Filled 2020-03-24: qty 0.3

## 2020-03-24 MED ORDER — TIMOLOL MALEATE 0.5 % OP SOLN
1.0000 [drp] | Freq: Every morning | OPHTHALMIC | Status: DC
  Administered 2020-03-25: 1 [drp] via OPHTHALMIC
  Filled 2020-03-24: qty 5

## 2020-03-24 MED ORDER — LISINOPRIL 10 MG PO TABS
20.0000 mg | ORAL_TABLET | Freq: Every day | ORAL | Status: DC
  Administered 2020-03-24: 20 mg via ORAL
  Filled 2020-03-24: qty 2

## 2020-03-24 MED ORDER — ALPRAZOLAM 0.5 MG PO TABS
0.5000 mg | ORAL_TABLET | Freq: Every day | ORAL | Status: DC | PRN
  Administered 2020-03-25: 0.5 mg via ORAL
  Filled 2020-03-24: qty 1

## 2020-03-24 MED ORDER — MECLIZINE HCL 25 MG PO TABS: 25.0000 mg | ORAL_TABLET | Freq: Three times a day (TID) | ORAL | Status: DC | PRN

## 2020-03-24 MED ORDER — IPRATROPIUM BROMIDE 0.06 % NA SOLN
2.0000 | NASAL | Status: DC | PRN
  Filled 2020-03-24: qty 15

## 2020-03-24 MED ORDER — HYDROCHLOROTHIAZIDE 12.5 MG PO CAPS
12.5000 mg | ORAL_CAPSULE | Freq: Every day | ORAL | Status: DC
  Administered 2020-03-24: 12.5 mg via ORAL
  Filled 2020-03-24: qty 1

## 2020-03-24 MED ORDER — CALCIUM CARBONATE 1250 (500 CA) MG PO TABS
625.0000 mg | ORAL_TABLET | Freq: Every day | ORAL | Status: DC
  Administered 2020-03-25: 625 mg via ORAL
  Filled 2020-03-24: qty 0.5

## 2020-03-24 MED ORDER — BRIMONIDINE TARTRATE 0.2 % OP SOLN
1.0000 [drp] | Freq: Every morning | OPHTHALMIC | Status: DC
  Filled 2020-03-24: qty 5

## 2020-03-24 MED ORDER — PRAVASTATIN SODIUM 20 MG PO TABS
20.0000 mg | ORAL_TABLET | Freq: Every day | ORAL | Status: DC
  Administered 2020-03-24: 20 mg via ORAL
  Filled 2020-03-24 (×2): qty 1

## 2020-03-24 MED ORDER — ADULT MULTIVITAMIN W/MINERALS CH
1.0000 | ORAL_TABLET | Freq: Every day | ORAL | Status: DC
  Administered 2020-03-25: 1 via ORAL
  Filled 2020-03-24: qty 1

## 2020-03-24 MED ORDER — ACETAMINOPHEN 500 MG PO TABS: 500.0000 mg | ORAL_TABLET | Freq: Four times a day (QID) | ORAL | Status: DC | PRN

## 2020-03-24 MED ORDER — QUINAPRIL-HYDROCHLOROTHIAZIDE 20-12.5 MG PO TABS: 1.0000 | ORAL_TABLET | Freq: Every day | ORAL | Status: DC

## 2020-03-24 MED ORDER — GADOBUTROL 1 MMOL/ML IV SOLN
7.0000 mL | Freq: Once | INTRAVENOUS | Status: AC | PRN
  Administered 2020-03-24: 7 mL via INTRAVENOUS

## 2020-03-24 MED ORDER — DOCUSATE SODIUM 100 MG PO CAPS
100.0000 mg | ORAL_CAPSULE | Freq: Every day | ORAL | Status: DC
  Administered 2020-03-24 – 2020-03-25 (×2): 100 mg via ORAL
  Filled 2020-03-24 (×2): qty 1

## 2020-03-24 MED ORDER — FERROUS SULFATE 325 (65 FE) MG PO TABS
325.0000 mg | ORAL_TABLET | Freq: Every day | ORAL | Status: DC
  Administered 2020-03-24 – 2020-03-25 (×2): 325 mg via ORAL
  Filled 2020-03-24 (×2): qty 1

## 2020-03-24 MED ORDER — LATANOPROST 0.005 % OP SOLN
1.0000 [drp] | Freq: Every day | OPHTHALMIC | Status: DC
  Filled 2020-03-24: qty 2.5

## 2020-03-24 MED ORDER — HYDRALAZINE HCL 20 MG/ML IJ SOLN: 5.0000 mg | INTRAMUSCULAR | Status: DC | PRN

## 2020-03-24 MED ORDER — CITALOPRAM HYDROBROMIDE 20 MG PO TABS
20.0000 mg | ORAL_TABLET | Freq: Every day | ORAL | Status: DC
  Administered 2020-03-24 – 2020-03-25 (×2): 20 mg via ORAL
  Filled 2020-03-24 (×2): qty 1

## 2020-03-24 MED ORDER — ARIPIPRAZOLE 5 MG PO TABS
5.0000 mg | ORAL_TABLET | Freq: Every day | ORAL | Status: DC
  Administered 2020-03-24 – 2020-03-25 (×2): 5 mg via ORAL
  Filled 2020-03-24 (×3): qty 1

## 2020-03-24 NOTE — H&P (Addendum)
History and Physical    [name]:[id number] DOB: 27-Jul-1925 DOA: 03/24/2020  Referring MD/NP/PA:   PCP: Marguarite Arbour, MD   Patient coming from:  The patient is coming from home.  At baseline, pt is partially dependent for most of ADL.        Chief Complaint: Dizziness and fall  HPI: Sarah Moon is a 86 y.o. female with medical history significant of hypertension, hyperlipidemia, GERD, depression, anxiety, vitamin B12 deficiency, anemia, CKD-3, who presents with dizziness and fall.  Patient states that she started feeling dizziness last night, and fell.  No loss of consciousness. She states she was too weak to get up. Family reported that pt was on the floor for approximately 45 minutes. She states she has lightheadedness and feel room spinning around her.  Sometimes has ear ringing.  No vision loss.  States that she has right knee weakness.  No unilateral numbness or tingling in extremities.  She states she has some pain in right face, lower back and right knee. Patient denies chest pain, shortness breath, cough, fever or chills.  She has nausea, but no vomiting, diarrhea or abdominal pain.  No symptoms of UTI.  ED Course: pt was found to have positive D-dimer 653, negative Covid PCR, troponin level 7, BNP 33, renal function at baseline, temperature normal, blood pressure 133/65, heart rate 92, 79, RR 22, oxygen saturation 92-98% on room air.  CT of head and CT of C-spine is negative for acute issues.  MRI of brain is negative for acute stroke.  X-ray of right knee and L-spine negative for acute bony fracture.  Patient is placed on MedSurg bed for observation.   MRI-brain: No evidence of acute or recent subacute infarction.  There are two punctate enhancing foci within the brainstem, one within the paramedian right pons and the other within the right midbrain. These foci may be vascular/benign in etiology. However, short-interval 3-6 month contrast-enhanced MRI  follow-up is recommended to ensure stability and exclude alternative etiologies.  Mild generalized cerebral atrophy and chronic small vessel ischemic disease, stable as compared to the brain MRI of 12/05/2018. Redemonstrated chronic lacunar infarct within the left caudate nucleus.  A nonspecific subcentimeter focus of chronic calcification within the anterior right frontal lobe periventricular white matter was better appreciated on the same-day head CT.  Mild ethmoid and maxillary sinus mucosal thickening.  Review of Systems:   General: no fevers, chills, no body weight gain, has fatigue HEENT: no blurry vision, hearing changes or sore throat Respiratory: no dyspnea, coughing, wheezing CV: no chest pain, no palpitations GI: has nausea, no vomiting, abdominal pain, diarrhea, constipation GU: no dysuria, burning on urination, increased urinary frequency, hematuria  Ext: no leg edema Neuro: no unilateral weakness, numbness, or tingling, no vision change or hearing loss. Has dizziness and fall. Skin: no rash, no skin tear. MSK: has right knee and back pain Heme: No easy bruising.  Travel history: No recent long distant travel.  Allergy:  Allergies  Allergen Reactions  . Augmentin [Amoxicillin-Pot Clavulanate] Nausea Only, Rash and Other (See Comments)    GI upset GI upset Has patient had a PCN reaction causing immediate rash, facial/tongue/throat swelling, SOB or lightheadedness with hypotension:No Has patient had a PCN reaction causing severe rash involving mucus membranes or skin necrosis:No Has patient had a PCN reaction that required hospitalization:No Has patient had a PCN reaction occurring within the last 10 years:Yes--upset stomach If all of the above answers are "NO", then may proceed  with Cephalosporin use.   Marland Kitchen Oxycodone Other (See Comments)    Memory loss, medication is too strong.    Past Medical History:  Diagnosis Date  . Anxiety   . Arthritis   . B12  deficiency   . Baker's cyst    Rt leg  . Elevated lipids   . Environmental and seasonal allergies   . GERD (gastroesophageal reflux disease)   . High cholesterol   . Hypertension   . Neuropathy     Past Surgical History:  Procedure Laterality Date  . ABDOMINAL HYSTERECTOMY    . CATARACT EXTRACTION W/ INTRAOCULAR LENS IMPLANT Bilateral   . CHOLECYSTECTOMY    . HERNIA REPAIR    . TOTAL HIP ARTHROPLASTY Left 10/27/2016   Procedure: TOTAL HIP ARTHROPLASTY;  Surgeon: Donato Heinz, MD;  Location: [facility] ORS;  Service: Orthopedics;  Laterality: Left;    Social History:  reports that she has never smoked. She has never used smokeless tobacco. She reports that she does not drink alcohol and does not use drugs.  Family History:  Family History  Problem Relation Age of Onset  . Alzheimer's disease Mother   . Heart attack Mother   . Stroke Father   . Alzheimer's disease Father      Prior to Admission medications   Medication Sig Start Date End Date Taking? Authorizing Provider  ARIPiprazole (ABILIFY) 5 MG tablet Take 5 mg by mouth daily. 03/18/20  Yes [provider]  bimatoprost (LUMIGAN) 0.01 % SOLN Place 1 drop into both eyes at bedtime.   Yes [provider]  calcium carbonate (OSCAL) 1500 (600 Ca) MG TABS tablet Take 600 mg by mouth daily with breakfast.    Yes [provider]  citalopram (CELEXA) 40 MG tablet Take 40 mg by mouth daily.    Yes [provider]  cyanocobalamin (,VITAMIN B-12,) 1000 MCG/ML injection Inject 1,000 mcg into the muscle every 30 (thirty) days.   Yes [provider]  docusate sodium (COLACE) 50 MG capsule Take 100 mg by mouth daily.   Yes [provider]  ferrous sulfate 325 (65 FE) MG tablet Take 1 tablet by mouth daily.   Yes [provider]  ipratropium (ATROVENT) 0.06 % nasal spray Place 2 sprays into the nose as needed. 07/31/19 07/30/20 Yes [provider]  Multiple Vitamin  (MULTI-VITAMIN) tablet Take 1 tablet by mouth daily.   Yes [provider]  pravastatin (PRAVACHOL) 20 MG tablet Take 20 mg by mouth at bedtime.   Yes [provider]  quinapril-hydrochlorothiazide (ACCURETIC) 20-12.5 MG per tablet Take 1 tablet by mouth daily.    Yes [provider]  solifenacin (VESICARE) 10 MG tablet Take 10 mg by mouth daily. 02/01/20  Yes [provider]  acetaminophen (TYLENOL) 500 MG tablet Take 500 mg by mouth every 6 (six) hours as needed (for pain.). Gel cap    [provider]  ALPRAZolam (XANAX) 0.5 MG tablet Take 0.5 mg by mouth daily as needed. 01/23/20   [provider]  mirabegron ER (MYRBETRIQ) 25 MG TB24 tablet Take 1 tablet (25 mg total) daily by mouth. Patient not taking: Reported on 03/24/2020 04/26/17   Vanna Scotland, MD  traMADol (ULTRAM) 50 MG tablet Take 1-2 tablets (50-100 mg total) by mouth every 4 (four) hours as needed for moderate pain. Patient not taking: Reported on 03/24/2020 10/29/16   Donato Heinz, MD    Physical Exam: Vitals:   03/24/20 1231 03/24/20 1233 03/24/20 1330  03/24/20 1530  BP: (!) 141/67 (!) 147/81 133/65 (!) 145/60  Pulse: 82 85 79 71  Resp: 17 (!) Temp:      TempSrc:      SpO2: 95% 97% 92% 96%  Weight:      Height:       General: Not in acute distress HEENT:       Eyes: PERRL, EOMI, no scleral icterus.       ENT: No discharge from the ears and nose, no pharynx injection, no tonsillar enlargement.        Neck: No JVD, no bruit, no mass felt. Heme: No neck lymph node enlargement. Cardiac: S1/S2, RRR, No murmurs, No gallops or rubs. Respiratory: Good air movement bilaterally. No rales, wheezing, rhonchi or rubs. GI: Soft, nondistended, nontender, no rebound pain, no organomegaly, BS present. GU: No hematuria Ext: No pitting leg edema bilaterally. 2+DP/PT pulse bilaterally. Musculoskeletal: No joint deformities, No joint redness or warmth, no limitation of  ROM in spin. Skin: No rashes.  Neuro: Alert, oriented X3, cranial nerves II-XII grossly intact, moves all extremities normally. Muscle strength 5/5 in all extremities, sensation to light touch intact. Brachial reflex 2+ bilaterally. Knee reflex 1+ bilaterally. Negative Babinski's sign. Normal finger to nose test. Psych: Patient is not psychotic, no suicidal or hemocidal ideation.  Labs on Admission: I have personally reviewed following labs and imaging studies  CBC: Recent Labs  Lab 03/23/20 2141  WBC 9.2  HGB 12.9  HCT 39.5  MCV 95.4  PLT 235   Basic Metabolic Panel: Recent Labs  Lab 03/23/20 2141  NA 141  K 4.0  CL 108  CO2 25  GLUCOSE 143*  BUN 25*  CREATININE 1.27*  CALCIUM 10.1   GFR: Estimated Creatinine Clearance: 28 mL/min (A) (by C-G formula based on SCr of 1.27 mg/dL (H)). Liver Function Tests: Recent Labs  Lab 03/24/20 1035  AST 23  ALT 15  ALKPHOS 64  BILITOT 1.0  PROT 7.3  ALBUMIN 4.2   No results for input(s): LIPASE, AMYLASE in the last 168 hours. No results for input(s): AMMONIA in the last 168 hours. Coagulation Profile: No results for input(s): INR, PROTIME in the last 168 hours. Cardiac Enzymes: Recent Labs  Lab 03/23/20 2141  CKTOTAL 107   BNP (last 3 results) No results for input(s): PROBNP in the last 8760 hours. HbA1C: No results for input(s): HGBA1C in the last 72 hours. CBG: No results for input(s): GLUCAP in the last 168 hours. Lipid Profile: No results for input(s): CHOL, HDL, LDLCALC, TRIG, CHOLHDL, LDLDIRECT in the last 72 hours. Thyroid Function Tests: No results for input(s): TSH, T4TOTAL, FREET4, T3FREE, THYROIDAB in the last 72 hours. Anemia Panel: No results for input(s): VITAMINB12, FOLATE, FERRITIN, TIBC, IRON, RETICCTPCT in the last 72 hours. Urine analysis:    Component Value Date/Time   COLORURINE YELLOW (A) 03/23/2020 1422   APPEARANCEUR HAZY (A) 03/23/2020 1422   APPEARANCEUR Clear 03/29/2017 1352    LABSPEC 1.025 03/23/2020 1422   PHURINE 5.0 03/23/2020 1422   GLUCOSEU NEGATIVE 03/23/2020 1422   HGBUR NEGATIVE 03/23/2020 1422   BILIRUBINUR NEGATIVE 03/23/2020 1422   BILIRUBINUR Negative 03/29/2017 1352   KETONESUR NEGATIVE 03/23/2020 1422   PROTEINUR NEGATIVE 03/23/2020 1422   NITRITE NEGATIVE 03/23/2020 1422   LEUKOCYTESUR NEGATIVE 03/23/2020 1422   Sepsis Labs: (procalcitonin:4,lacticidven:4) ) Recent Results (from the past 240 hour(s))  Respiratory Panel by RT PCR (Flu A&B, Covid) - Nasopharyngeal Swab     Status:  None   Collection Time: 03/24/20 10:35 AM   Specimen: Nasopharyngeal Swab  Result Value Ref Range Status   SARS Coronavirus 2 by RT PCR NEGATIVE NEGATIVE Final    Comment: (NOTE) SARS-CoV-2 target nucleic acids are NOT DETECTED.  The SARS-CoV-2 RNA is generally detectable in upper respiratoy specimens during the acute phase of infection. The lowest concentration of SARS-CoV-2 viral copies this assay can detect is 131 copies/mL. A negative result does not preclude SARS-Cov-2 infection and should not be used as the sole basis for treatment or other patient management decisions. A negative result may occur with  improper specimen collection/handling, submission of specimen other than nasopharyngeal swab, presence of viral mutation(s) within the areas targeted by this assay, and inadequate number of viral copies (<131 copies/mL). A negative result must be combined with clinical observations, patient history, and epidemiological information. The expected result is Negative.  Fact Sheet for Patients:  https://www.moore.com/  Fact Sheet for Healthcare Providers:  https://www.young.biz/  This test is no t yet approved or cleared by the [place] FDA and  has been authorized for detection and/or diagnosis of SARS-CoV-2 by FDA under an Emergency Use Authorization (EUA). This EUA will remain  in effect (meaning  this test can be used) for the duration of the COVID-19 declaration under Section 564(b)(1) of the Act, 21 U.S.C. section 360bbb-3(b)(1), unless the authorization is terminated or revoked sooner.     Influenza A by PCR NEGATIVE NEGATIVE Final   Influenza B by PCR NEGATIVE NEGATIVE Final    Comment: (NOTE) The Xpert Xpress SARS-CoV-2/FLU/RSV assay is intended as an aid in  the diagnosis of influenza from Nasopharyngeal swab specimens and  should not be used as a sole basis for treatment. Nasal washings and  aspirates are unacceptable for Xpert Xpress SARS-CoV-2/FLU/RSV  testing.  Fact Sheet for Patients: https://www.moore.com/  Fact Sheet for Healthcare Providers: https://www.young.biz/  This test is not yet approved or cleared by the [place] FDA and  has been authorized for detection and/or diagnosis of SARS-CoV-2 by  FDA under an Emergency Use Authorization (EUA). This EUA will remain  in effect (meaning this test can be used) for the duration of the  Covid-19 declaration under Section 564(b)(1) of the Act, 21  U.S.C. section 360bbb-3(b)(1), unless the authorization is  terminated or revoked. Performed at [facility], [address] [place], [place] [address]      Radiological Exams on Admission: DG Chest 2 View  Result Date: 03/24/2020 CLINICAL DATA:  Near syncope EXAM: CHEST - 2 VIEW COMPARISON:  08/28/2015 FINDINGS: The heart size and mediastinal contours are stable. Mild streaky bibasilar opacities, favor atelectasis. Chronically coarsened interstitial markings. Lungs are otherwise clear. No pleural effusion. No pneumothorax. Advanced degenerative changes of the right glenohumeral joint. IMPRESSION: Mild streaky bibasilar opacities, favor atelectasis. Electronically Signed   By: Duanne Guess D.O.   On: 03/24/2020 08:01   DG Lumbar Spine 2-3 Views  Result Date: 03/24/2020 CLINICAL DATA:  Low back pain after fall  EXAM: LUMBAR SPINE - 2-3 VIEW COMPARISON:  02/05/2014 FINDINGS: Five lumbar type vertebral segments. Vertebral body heights and alignment are maintained. No fracture identified. Mild disc height loss most pronounced at L4-5. Multilevel degenerative endplate changes. Mild lower lumbar facet arthrosis. IMPRESSION: No fracture identified. Mild degenerative changes. Electronically Signed   By: Duanne Guess D.O.   On: 03/24/2020 11:17   DG Knee 2 Views Right  Result Date: 03/24/2020 CLINICAL DATA:  Right knee pain after fall. EXAM: RIGHT  KNEE - 1-2 VIEW COMPARISON:  None. FINDINGS: No evidence of fracture, dislocation, or joint effusion. Moderate degenerative changes are seen involving the medial and lateral joint spaces, with osteophyte formation seen laterally. Soft tissues are unremarkable. IMPRESSION: Moderate degenerative joint disease. No acute abnormality seen in the right knee. Electronically Signed   By: Lupita Raider M.D.   On: 03/24/2020 11:16   CT Head Wo Contrast  Result Date: 03/24/2020 CLINICAL DATA:  Dizziness, nonspecific. Neck trauma. Additional provided: Fall. EXAM: CT HEAD WITHOUT CONTRAST CT CERVICAL SPINE WITHOUT CONTRAST TECHNIQUE: Multidetector CT imaging of the head and cervical spine was performed following the standard protocol without intravenous contrast. Multiplanar CT image reconstructions of the cervical spine were also generated. COMPARISON:  Head CT 01/23/2020. FINDINGS: CT HEAD FINDINGS Brain: Stable, mild generalized parenchymal atrophy. Stable, mild ill-defined hypoattenuation within the cerebral white matter which is nonspecific, but consistent with chronic small vessel ischemic disease. Redemonstrated small chronic lacunar infarct within the left caudate head. Nonspecific subcentimeter focus of parenchymal calcification within the anterior right frontal lobe periventricular white matter (series 2, image 15). There is no acute intracranial hemorrhage. No demarcated  cortical infarct. No extra-axial fluid collection. No evidence of intracranial mass. No midline shift. Vascular: No hyperdense vessel.  Atherosclerotic calcifications. Skull: Normal. Negative for fracture or focal lesion. Sinuses/Orbits: Visualized orbits show no acute finding. No significant paranasal sinus disease or mastoid effusion at the imaged levels. CT CERVICAL SPINE FINDINGS Alignment: Mild nonspecific reversal of the expected cervical lordosis. Trace C2-C3 grade 1 anterolisthesis. 2 mm C4-C5 grade 1 anterolisthesis. Trace C7-T1 grade 1 anterolisthesis. Skull base and vertebrae: The basion-dental and atlanto-dental intervals are maintained.No evidence of acute fracture to the cervical spine. Well corticated chronic appearing deformity of the posterosuperior aspect of the C4 vertebral body (series 4, image 24). Soft tissues and spinal canal: No prevertebral fluid or swelling. No visible canal hematoma. Disc levels: Cervical spondylosis with multilevel disc space narrowing, disc bulges, posterior disc osteophytes, uncovertebral hypertrophy and facet arthrosis. No high-grade bony spinal canal stenosis. Multilevel bony neural foraminal narrowing. Upper chest: No consolidation within the imaged lung apices. No visible pneumothorax. IMPRESSION: CT head: 1. No evidence of acute intracranial abnormality. 2. Stable, mild generalized atroaphy of the brain and chronic small vessel ischemic disease. Redemonstrated chronic left basal ganglia lacunar infarct. 3. Nonspecific subcentimeter focus of parenchymal calcification within the anterior right frontal lobe periventricular white matter, unchanged as compared to the head CT of 01/23/2020. CT cervical spine: 1. No evidence of acute fracture to the cervical spine. 2. Nonspecific reversal of the expected cervical lordosis. 3. Mild grade 1 anterolisthesis at C2-C3, C4-C5 and C7-T1 as detailed. 4. Cervical spondylosis as described. Electronically Signed   By: Jackey Loge  DO   On: 03/24/2020 11:07   CT Cervical Spine Wo Contrast  Result Date: 03/24/2020 CLINICAL DATA:  Dizziness, nonspecific. Neck trauma. Additional provided: Fall. EXAM: CT HEAD WITHOUT CONTRAST CT CERVICAL SPINE WITHOUT CONTRAST TECHNIQUE: Multidetector CT imaging of the head and cervical spine was performed following the standard protocol without intravenous contrast. Multiplanar CT image reconstructions of the cervical spine were also generated. COMPARISON:  Head CT 01/23/2020. FINDINGS: CT HEAD FINDINGS Brain: Stable, mild generalized parenchymal atrophy. Stable, mild ill-defined hypoattenuation within the cerebral white matter which is nonspecific, but consistent with chronic small vessel ischemic disease. Redemonstrated small chronic lacunar infarct within the left caudate head. Nonspecific subcentimeter focus of parenchymal calcification within the anterior right frontal lobe periventricular white matter (series 2,  image 15). There is no acute intracranial hemorrhage. No demarcated cortical infarct. No extra-axial fluid collection. No evidence of intracranial mass. No midline shift. Vascular: No hyperdense vessel.  Atherosclerotic calcifications. Skull: Normal. Negative for fracture or focal lesion. Sinuses/Orbits: Visualized orbits show no acute finding. No significant paranasal sinus disease or mastoid effusion at the imaged levels. CT CERVICAL SPINE FINDINGS Alignment: Mild nonspecific reversal of the expected cervical lordosis. Trace C2-C3 grade 1 anterolisthesis. 2 mm C4-C5 grade 1 anterolisthesis. Trace C7-T1 grade 1 anterolisthesis. Skull base and vertebrae: The basion-dental and atlanto-dental intervals are maintained.No evidence of acute fracture to the cervical spine. Well corticated chronic appearing deformity of the posterosuperior aspect of the C4 vertebral body (series 4, image 24). Soft tissues and spinal canal: No prevertebral fluid or swelling. No visible canal hematoma. Disc levels:  Cervical spondylosis with multilevel disc space narrowing, disc bulges, posterior disc osteophytes, uncovertebral hypertrophy and facet arthrosis. No high-grade bony spinal canal stenosis. Multilevel bony neural foraminal narrowing. Upper chest: No consolidation within the imaged lung apices. No visible pneumothorax. IMPRESSION: CT head: 1. No evidence of acute intracranial abnormality. 2. Stable, mild generalized atroaphy of the brain and chronic small vessel ischemic disease. Redemonstrated chronic left basal ganglia lacunar infarct. 3. Nonspecific subcentimeter focus of parenchymal calcification within the anterior right frontal lobe periventricular white matter, unchanged as compared to the head CT of 01/23/2020. CT cervical spine: 1. No evidence of acute fracture to the cervical spine. 2. Nonspecific reversal of the expected cervical lordosis. 3. Mild grade 1 anterolisthesis at C2-C3, C4-C5 and C7-T1 as detailed. 4. Cervical spondylosis as described. Electronically Signed   By: Jackey LogeKyle  Golden DO   On: 03/24/2020 11:07   MR Brain W and Wo Contrast  Result Date: 03/24/2020 CLINICAL DATA:  Dizziness, nonspecific. Additional history provided: Fall last night hitting head, intermittent hallucinations today, patient unable to sleep, history of prior CVA. EXAM: MRI HEAD WITHOUT AND WITH CONTRAST TECHNIQUE: Multiplanar, multiecho pulse sequences of the brain and surrounding structures were obtained without and with intravenous contrast. CONTRAST:  7mL GADAVIST GADOBUTROL 1 MMOL/ML IV SOLN COMPARISON:  Head CT 03/24/2020, brain MRI 12/05/2018. FINDINGS: Brain: Mild generalized cerebral atrophy. Redemonstrated chronic lacunar infarct within the left caudate head (series 10, image 16). Mild multifocal T2/FLAIR hyperintensity within the cerebral white matter which is nonspecific, but consistent with chronic small vessel ischemic disease. A nonspecific subcentimeter focus chronic parenchymal calcification within the  anterior right frontal lobe periventricular white matter was better appreciated on the same-day head CT. Two punctate enhancing foci are demonstrated within the brainstem, 1 within the paramedian right pons (series 18, image 59) and the other within the right midbrain (series 19, image 15) (series 20, image 11). There is no acute infarct. No chronic intracranial blood products. No extra-axial fluid collection. No midline shift. Vascular: Expected proximal arterial flow voids. Skull and upper cervical spine: No focal marrow lesion. Sinuses/Orbits: Visualized orbits show no acute finding. Mild ethmoid and maxillary sinus mucosal thickening. No significant mastoid effusion. IMPRESSION: No evidence of acute or recent subacute infarction. There are two punctate enhancing foci within the brainstem, one within the paramedian right pons and the other within the right midbrain. These foci may be vascular/benign in etiology. However, short-interval 3-6 month contrast-enhanced MRI follow-up is recommended to ensure stability and exclude alternative etiologies. Mild generalized cerebral atrophy and chronic small vessel ischemic disease, stable as compared to the brain MRI of 12/05/2018. Redemonstrated chronic lacunar infarct within the left caudate nucleus. A nonspecific subcentimeter  focus of chronic calcification within the anterior right frontal lobe periventricular white matter was better appreciated on the same-day head CT. Mild ethmoid and maxillary sinus mucosal thickening. Electronically Signed   By: Jackey Loge DO   On: 03/24/2020 16:58     EKG: I have personally reviewed.  Sinus rhythm, QTC 433, low voltage, poor R wave progression  Assessment/Plan Principal Problem:   Fall at home, initial encounter Active Problems:   Hypertension   High cholesterol   Depression with anxiety   CKD (chronic kidney disease), stage IIIa   Fall   Dizziness   Dizzines and fall at home, initial encounter: Etiology is not  clear for her dizziness.  CT head is negative.  MRI of brain did not show stroke.  Patient reports hearing any sometimes and feeling room spinning, indicating possible peripheral vertigo.  Patient has some mild positive D-dimer, but no chest pain or shortness breath.  Low suspicions for PE.  Will need to rule out DVT.  -Placed on MedSurg bed for observation -Check orthostatic status -PT/OT -will get Lower extremity Doppler to rule out DVT due to positive D-dimer -cardiac monitoring overnight  Hypertension -IV hydralazine as needed -Lisinopril -Hold HCTZ  High cholesterol -Pravastatin  Depression with anxiety -Continue home medications  CKD (chronic kidney disease), stage IIIa -Follow-up with BMP         DVT ppx: SQ Lovenox Code Status: Partial code (I discussed with the patient in the presence of her daughter, and explained the meaning of CODE STATUS, patient wants to be partial code, OK for CPR, but no intubation). Family Communication:   Yes, patient's daughter   at bed side Disposition Plan:  Anticipate discharge back to previous environment Consults called: None  admission status: Med-surg bed for obs   Status is: Observation  The patient remains OBS appropriate and will d/c before 2 midnights.  Dispo: The patient is from: Home              Anticipated d/c is to: Home              Anticipated d/c date is: 1 day              Patient currently is not medically stable to d/c.          Date of Service 03/24/2020    Lorretta Harp [facility]   If 7PM-7AM, please contact night-coverage [email or web address] 03/24/2020, 7:29 PM

## 2020-03-24 NOTE — Progress Notes (Signed)
PHARMACIST - PHYSICIAN COMMUNICATION  CONCERNING:  Enoxaparin (Lovenox) for DVT Prophylaxis    RECOMMENDATION: Patient was prescribed enoxaprin 40mg  q24 hours for VTE prophylaxis.   Filed Weights   03/23/20 2125  Weight: 78.5 kg (173 lb)    Body mass index is 30.65 kg/m.  Estimated Creatinine Clearance: 28 mL/min (A) (by C-G formula based on SCr of 1.27 mg/dL (H)).   Patient is candidate for enoxaparin 30mg  every 24 hours based on CrCl <46ml/min or Weight <45kg  DESCRIPTION: Pharmacy has adjusted enoxaparin dose per [facility] policy.  Patient is now receiving enoxaparin 30 mg every 24 hours    31m, PharmD Clinical Pharmacist  03/24/2020 9:21 PM

## 2020-03-24 NOTE — ED Notes (Signed)
Pt taken to radiology at this time.

## 2020-03-24 NOTE — ED Provider Notes (Signed)
[facility] Emergency Department Provider Note  ____________________________________________   First MD Initiated Contact with Patient 03/24/20 1009     (approximate)  I have reviewed the triage vital signs and the nursing notes.   HISTORY  Chief Complaint Fall and Near Syncope   HPI Sarah Moon is a 86 y.o. female with a past medical history of dementia, anxiety, B12 deficiency, HDL, HTN, peripheral neuropathy, GERD, and glaucoma who presents accompanied by her son after a syncopal episode that occurred yesterday and was associate with a fall.  Patient states she has had dizzy spells for some time but states that yesterday she felt significant more dizzy than usual while walking in her home and fell striking the right side of her face.  She does not think she lost consciousness.  Denies urinary incontinence or tongue biting.  She states she was too weak to get up and her son states that when he found her it was approximately 45 minutes after she pressed her life alert button.  Patient states she is some soreness in her right shoulder, lower back, and right knee when she hit when she fell but denies any other acute pain including in her neck, mid back, or otherwise in her extremities.  She denies being on any blood thinners.  Denies EtOH or illicit drug use.  She states that she still feels a little bit wobbly but not quite as weak as when she was on the floor yesterday.          Past Medical History:  Diagnosis Date  . Anxiety   . Arthritis   . B12 deficiency   . Baker's cyst    Rt leg  . Elevated lipids   . Environmental and seasonal allergies   . GERD (gastroesophageal reflux disease)   . High cholesterol   . Hypertension   . Neuropathy     Patient Active Problem List   Diagnosis Date Noted  . Fall at home, initial encounter 03/24/2020  . Fall 03/24/2020  . Hypertension   . High cholesterol   . Depression with anxiety   . CKD (chronic  kidney disease), stage IIIa   . Status post total replacement of hip 10/27/2016    Past Surgical History:  Procedure Laterality Date  . ABDOMINAL HYSTERECTOMY    . CATARACT EXTRACTION W/ INTRAOCULAR LENS IMPLANT Bilateral   . CHOLECYSTECTOMY    . HERNIA REPAIR    . TOTAL HIP ARTHROPLASTY Left 10/27/2016   Procedure: TOTAL HIP ARTHROPLASTY;  Surgeon: Donato Heinz, MD;  Location: [facility] ORS;  Service: Orthopedics;  Laterality: Left;    Prior to Admission medications   Medication Sig Start Date End Date Taking? Authorizing Provider  ARIPiprazole (ABILIFY) 5 MG tablet Take 5 mg by mouth daily. 03/18/20  Yes [provider]  bimatoprost (LUMIGAN) 0.01 % SOLN Place 1 drop into both eyes at bedtime.   Yes [provider]  calcium carbonate (OSCAL) 1500 (600 Ca) MG TABS tablet Take 600 mg by mouth daily with breakfast.    Yes [provider]  citalopram (CELEXA) 40 MG tablet Take 40 mg by mouth daily.    Yes [provider]  cyanocobalamin (,VITAMIN B-12,) 1000 MCG/ML injection Inject 1,000 mcg into the muscle every 30 (thirty) days.   Yes [provider]  docusate sodium (COLACE) 50 MG capsule Take 100 mg by mouth daily.   Yes [provider]  ferrous sulfate 325 (65 FE) MG tablet Take 1  tablet by mouth daily.   Yes [provider]  ipratropium (ATROVENT) 0.06 % nasal spray Place 2 sprays into the nose as needed. 07/31/19 07/30/20 Yes [provider]  Multiple Vitamin (MULTI-VITAMIN) tablet Take 1 tablet by mouth daily.   Yes [provider]  pravastatin (PRAVACHOL) 20 MG tablet Take 20 mg by mouth at bedtime.   Yes [provider]  quinapril-hydrochlorothiazide (ACCURETIC) 20-12.5 MG per tablet Take 1 tablet by mouth daily.    Yes [provider]  solifenacin (VESICARE) 10 MG tablet Take 10 mg by mouth daily. 02/01/20  Yes [provider]  acetaminophen (TYLENOL) 500 MG tablet Take 500 mg by  mouth every 6 (six) hours as needed (for pain.). Gel cap    [provider]  ALPRAZolam (XANAX) 0.5 MG tablet Take 0.5 mg by mouth daily as needed. 01/23/20   [provider]  mirabegron ER (MYRBETRIQ) 25 MG TB24 tablet Take 1 tablet (25 mg total) daily by mouth. Patient not taking: Reported on 03/24/2020 04/26/17   Vanna ScotlandBrandon, Ashley, MD  traMADol (ULTRAM) 50 MG tablet Take 1-2 tablets (50-100 mg total) by mouth every 4 (four) hours as needed for moderate pain. Patient not taking: Reported on 03/24/2020 10/29/16   Donato HeinzHooten, James P, MD    Allergies Augmentin [amoxicillin-pot clavulanate] and Oxycodone  No family history on file.  Social History Social History   Tobacco Use  . Smoking status: Never Smoker  . Smokeless tobacco: Never Used  Substance Use Topics  . Alcohol use: No  . Drug use: No    Review of Systems  Review of Systems  Constitutional: Negative for chills and fever.  HENT: Negative for sore throat.   Eyes: Negative for pain.  Respiratory: Negative for cough and stridor.   Cardiovascular: Negative for chest pain.  Gastrointestinal: Negative for vomiting.  Genitourinary: Negative for dysuria.  Musculoskeletal: Positive for back pain. Negative for myalgias and neck pain.  Skin: Negative for rash.  Neurological: Positive for dizziness. Negative for seizures, loss of consciousness and headaches.  Psychiatric/Behavioral: Negative for suicidal ideas.  All other systems reviewed and are negative.     ____________________________________________   PHYSICAL EXAM:  VITAL SIGNS: ED Triage Vitals [03/23/20 2125]  Enc Vitals Group     BP (!) 159/87     Pulse Rate 91     Resp 18     Temp (!) 97.5 F (36.4 C)     Temp Source Oral     SpO2 94 %     Weight 173 lb (78.5 kg)     Height 5\' 3"  (1.6 m)     Head Circumference      Peak Flow      Pain Score 0     Pain Loc      Pain Edu?      Excl. in GC?    Vitals:   03/24/20 1233 03/24/20 1330  BP:  (!) 147/81 133/65  Pulse: 85 79  Resp: (!) 22 19  Temp:    SpO2: 97% 92%   Physical Exam Vitals and nursing note reviewed.  Constitutional:      General: She is not in acute distress.    Appearance: She is well-developed.  HENT:     Head: Normocephalic and atraumatic.     Right Ear: External ear normal.     Left Ear: External ear normal.     Nose: Nose normal.  Eyes:     Conjunctiva/sclera: Conjunctivae normal.  Cardiovascular:  Rate and Rhythm: Normal rate and regular rhythm.     Heart sounds: No murmur heard.   Pulmonary:     Effort: Pulmonary effort is normal. No respiratory distress.     Breath sounds: Normal breath sounds.  Abdominal:     Palpations: Abdomen is soft.     Tenderness: There is no abdominal tenderness.  Musculoskeletal:     Cervical back: Neck supple.  Skin:    General: Skin is warm and dry.     Capillary Refill: Capillary refill takes less than 2 seconds.  Neurological:     Mental Status: She is alert and oriented to person, place, and time.  Psychiatric:        Mood and Affect: Mood normal.     Cranial nerves II through XII grossly intact.  Patient has full and symmetric strength on her bilateral upper and lower extremities.  Sensation intact to light touch over all extremities.  No pronator drift.  No finger dysmetria.  Patient has some mild tenderness over her right patella and anterior right shoulder.  There are no significant deformities over either joint and patient's remainder of her upper or lower extremity joints including bilateral elbows, wrists, hips, left knee, and ankles are unremarkable.  2+ bilateral radial and DP pulses.  No tenderness over the C or T-spine with some mild tenderness over the L-spine.  When I attempted to assess patient to standing upon standing patient states she felt very wobbly and woozy and too unsteady to ambulate. ____________________________________________   LABS (all labs ordered are listed, but only abnormal  results are displayed)  Labs Reviewed  BASIC METABOLIC PANEL - Abnormal; Notable for the following components:      Result Value   Glucose, Bld 143 (*)    BUN 25 (*)    Creatinine, Ser 1.27 (*)    GFR, Estimated 37 (*)    All other components within normal limits  URINALYSIS, COMPLETE (UACMP) WITH MICROSCOPIC - Abnormal; Notable for the following components:   Color, Urine YELLOW (*)    APPearance HAZY (*)    Bacteria, UA RARE (*)    All other components within normal limits  FIBRIN DERIVATIVES D-DIMER ([facility] ONLY) - Abnormal; Notable for the following components:   Fibrin derivatives D-dimer ([facility]) 653.24 (*)    All other components within normal limits  RESPIRATORY PANEL BY RT PCR (FLU A&B, COVID)  CBC  CK  HEPATIC FUNCTION PANEL  BRAIN NATRIURETIC PEPTIDE  VITAMIN B12  CBG MONITORING, ED  TROPONIN I (HIGH SENSITIVITY)  TROPONIN I (HIGH SENSITIVITY)   ____________________________________________  EKG  Sinus rhythm with a ventricular rate of 95, normal axis, unremarkable intervals, artifact in V6 and lead III with no clear evidence of acute ischemia. ____________________________________________  RADIOLOGY  ED MD interpretation: Chest x-ray shows no evidence of pneumonia, effusion, edema, pneumothorax, or other clear acute intrathoracic process.  Official radiology report(s): DG Chest 2 View  Result Date: 03/24/2020 CLINICAL DATA:  Near syncope EXAM: CHEST - 2 VIEW COMPARISON:  08/28/2015 FINDINGS: The heart size and mediastinal contours are stable. Mild streaky bibasilar opacities, favor atelectasis. Chronically coarsened interstitial markings. Lungs are otherwise clear. No pleural effusion. No pneumothorax. Advanced degenerative changes of the right glenohumeral joint. IMPRESSION: Mild streaky bibasilar opacities, favor atelectasis. Electronically Signed   By: Duanne Guess D.O.   On: 03/24/2020 08:01   CT Head 1. No evidence of acute intracranial abnormality. 2.  Stable, mild generalized atroaphy of the brain and chronic small vessel  ischemic disease. Redemonstrated chronic left basal ganglia lacunar infarct. 3. Nonspecific subcentimeter focus of parenchymal calcification within the anterior right frontal lobe periventricular white matter, unchanged as compared to the head CT of 01/23/2020. CT C-Spine 1. No evidence of acute fracture to the cervical spine. 2. Nonspecific reversal of the expected cervical lordosis. 3. Mild grade 1 anterolisthesis at C2-C3, C4-C5 and C7-T1 as detailed. 4. Cervical spondylosis as described. XR R Knee Moderate degenerative joint disease. No acute abnormality seen in the right knee. XR L Spine No fracture identified. Mild degenerative changes. ____________________________________________   PROCEDURES  Procedure(s) performed (including Critical Care):  .1-3 Lead EKG Interpretation Performed by: Gilles Chiquito, MD Authorized by: Gilles Chiquito, MD     Interpretation: normal     ECG rate assessment: normal     Rhythm: sinus rhythm     Ectopy: none     Conduction: normal       ____________________________________________   INITIAL IMPRESSION / ASSESSMENT AND PLAN / ED COURSE        Patient presents with [place] to history exam for assessment after syncopal episode associate with a fall as described above.  With regard to patient's episode of dizziness that caused her to fall up yesterday, differential includes but is not limited to ACS, PE, arrhythmia, acute symptomatic anemia, CHF, CVA, metabolic derangements, and cardiomyopathy.  With regard to patient's injuries from her fall impression is mild contusion of the anterior right shoulder and right prepatellar patient is neurovascular intact in both of these extremities and has no evidence of fracture dislocation on plain films.  Low suspicion for PE as patient's D-dimer is within age-adjusted limits. Low suspicion for ACS given patient denies any  chest pain and has a reassuring EKG with nonelevated opponent. No significant arrhythmia identified on ECG. No significant electrolyte or metabolic derangements identified on BMP which does show a glucose of 143. CBC is unremarkable and has no evidence of anemia. CKs within normal limits not consistent rhabdomyolysis. Patient does not appear volume overloaded on exam, x-ray, and BNP is thirty-three out low suspicion for CHF at this time.  UA does not appear infected.  While there are no focal deficits on exam or clear evidence of acute ischemia on CT head given patient is unable to stand endorses some persistent vertiginous symptoms will obtain MRI to assess for evidence of CVA.  All admit to hospital service for further evaluation management as well as possible additional work-up of presyncope precipitated patient's fall yesterday.    ____________________________________________   FINAL CLINICAL IMPRESSION(S) / ED DIAGNOSES  Final diagnoses:  Fall, initial encounter  Dizziness    Medications  acetaminophen (TYLENOL) tablet 500 mg (has no administration in time range)  citalopram (CELEXA) tablet 20 mg (has no administration in time range)  hydroxypropyl methylcellulose / hypromellose (ISOPTO TEARS / GONIOVISC) 2.5 % ophthalmic solution 1 drop (has no administration in time range)  pravastatin (PRAVACHOL) tablet 20 mg (has no administration in time range)  hydrochlorothiazide (MICROZIDE) capsule 12.5 mg (has no administration in time range)  lisinopril (ZESTRIL) tablet 20 mg (has no administration in time range)  brimonidine (ALPHAGAN) 0.2 % ophthalmic solution 1 drop (has no administration in time range)    And  timolol (TIMOPTIC) 0.5 % ophthalmic solution 1 drop (has no administration in time range)     ED Discharge Orders    None       Note:  This document was prepared using Dragon voice recognition software and may include  unintentional dictation errors.   Gilles Chiquito,  MD 03/24/20 [phone]

## 2020-03-25 DIAGNOSIS — W19XXXA Unspecified fall, initial encounter: Secondary | ICD-10-CM | POA: Diagnosis not present

## 2020-03-25 DIAGNOSIS — R42 Dizziness and giddiness: Secondary | ICD-10-CM | POA: Diagnosis not present

## 2020-03-25 DIAGNOSIS — Y92009 Unspecified place in unspecified non-institutional (private) residence as the place of occurrence of the external cause: Secondary | ICD-10-CM | POA: Diagnosis not present

## 2020-03-25 LAB — BASIC METABOLIC PANEL
Anion gap: 7 (ref 5–15)
BUN: 26 mg/dL — ABNORMAL HIGH (ref 8–23)
CO2: 27 mmol/L (ref 22–32)
Calcium: 9.7 mg/dL (ref 8.9–10.3)
Chloride: 106 mmol/L (ref 98–111)
Creatinine, Ser: 1.05 mg/dL — ABNORMAL HIGH (ref 0.44–1.00)
GFR, Estimated: 46 mL/min — ABNORMAL LOW (ref >60)
Glucose, Bld: 101 mg/dL — ABNORMAL HIGH (ref 70–99)
Potassium: 3.8 mmol/L (ref 3.5–5.1)
Sodium: 140 mmol/L (ref 135–145)

## 2020-03-25 LAB — CBC
HCT: 37.1 % (ref 36.0–46.0)
Hemoglobin: 12.2 g/dL (ref 12.0–15.0)
MCH: 31.6 pg (ref 26.0–34.0)
MCHC: 32.9 g/dL (ref 30.0–36.0)
MCV: 96.1 fL (ref 80.0–100.0)
Platelets: 204 10*3/uL (ref 150–400)
RBC: 3.86 MIL/uL — ABNORMAL LOW (ref 3.87–5.11)
RDW: 13.2 % (ref 11.5–15.5)
WBC: 9.3 10*3/uL (ref 4.0–10.5)
nRBC: 0 % (ref 0.0–0.2)

## 2020-03-25 MED ORDER — ENOXAPARIN SODIUM 40 MG/0.4ML ~~LOC~~ SOLN: 40.0000 mg | SUBCUTANEOUS | Status: DC

## 2020-03-25 MED ORDER — MECLIZINE HCL 25 MG PO TABS
25.0000 mg | ORAL_TABLET | Freq: Three times a day (TID) | ORAL | 1 refills | Status: DC | PRN
Start: 2020-03-25 — End: 2020-03-25

## 2020-03-25 MED ORDER — MECLIZINE HCL 25 MG PO TABS
25.0000 mg | ORAL_TABLET | Freq: Three times a day (TID) | ORAL | 1 refills | Status: DC | PRN
Start: 2020-03-25 — End: 2023-12-23

## 2020-03-25 MED ORDER — QUINAPRIL-HYDROCHLOROTHIAZIDE 20-12.5 MG PO TABS: 1.0000 | ORAL_TABLET | Freq: Every day | ORAL | Status: DC

## 2020-03-25 MED ORDER — LISINOPRIL 10 MG PO TABS
20.0000 mg | ORAL_TABLET | Freq: Every day | ORAL | Status: DC
  Administered 2020-03-25: 20 mg via ORAL
  Filled 2020-03-25: qty 2

## 2020-03-25 MED ORDER — HYDROCHLOROTHIAZIDE 12.5 MG PO CAPS
12.5000 mg | ORAL_CAPSULE | Freq: Every day | ORAL | Status: DC
  Administered 2020-03-25: 12.5 mg via ORAL
  Filled 2020-03-25: qty 1

## 2020-03-25 NOTE — Discharge Summary (Signed)
Physician Discharge Summary  Sarah Moon:[id number] DOB: 13-Apr-1926 DOA: 03/24/2020  PCP: Marguarite Arbour, MD  Admit date: 03/24/2020 Discharge date: 04/06/2020  Admitted From: home Disposition:  home  Recommendations for Outpatient Follow-up:  1. Follow up with PCP in 1-2 weeks 2. Please obtain BMP/CBC in one week  Home Health: PT,  OT Equipment/Devices: rolling walker   Discharge Condition: stable  CODE STATUS: Partial (yes to CPR, no intubation)  Diet recommendation: Heart Healthy    Discharge Diagnoses: Principal Problem:   Fall at home, initial encounter Active Problems:   Hypertension   High cholesterol   Depression with anxiety   CKD (chronic kidney disease), stage IIIa   Fall   Dizziness    Summary of HPI and Hospital Course:    HPI on admission from H&P by Dr. Clyde Moon: "HPI: Sarah Moon is a 86 y.o. female with medical history significant of hypertension, hyperlipidemia, GERD, depression, anxiety, vitamin B12 deficiency, anemia, CKD-3, who presents with dizziness and fall.  Patient states that she started feeling dizziness last night, and fell.  No loss of consciousness. She states she was too weak to get up. Family reported that pt was on the floor for approximately 45 minutes. She states she has lightheadedness and feel room spinning around her.  Sometimes has ear ringing.  No vision loss.  States that she has right knee weakness.  No unilateral numbness or tingling in extremities.  She states she has some pain in right face, lower back and right knee. Patient denies chest pain, shortness breath, cough, fever or chills.  She has nausea, but no vomiting, diarrhea or abdominal pain.  No symptoms of UTI.  ED Course: pt was found to have positive D-dimer 653, negative Covid PCR, troponin level 7, BNP 33, renal function at baseline, temperature normal, blood pressure 133/65, heart rate 92, 79, RR 22, oxygen saturation 92-98% on room air.  CT of head and CT  of C-spine is negative for acute issues.  MRI of brain is negative for acute stroke.  X-ray of right knee and L-spine negative for acute bony fracture.  Patient is placed on MedSurg bed for observation.   MRI-brain: --No evidence of acute or recent subacute infarction. --There are two punctate enhancing foci within the brainstem, one within the paramedian right pons and the other within the right midbrain. These foci may be vascular/benign in etiology. However, short-interval 3-6 month contrast-enhanced MRI follow-up is recommended to ensure stability and exclude alternative etiologies. --Mild generalized cerebral atrophy and chronic small vessel ischemic disease, stable as compared to the brain MRI of 12/05/2018.  Redemonstrated chronic lacunar infarct within the left caudate nucleus. --A nonspecific subcentimeter focus of chronic calcification within the anterior right frontal lobe periventricular white matter was better appreciated on the same-day head CT. --Mild ethmoid and maxillary sinus mucosal thickening."   Patient had negative orthostatic vitals.  She later described her dizziness in more detail, with room spinning sensation, and reported history of vertigo in the past.    Patient had lower extremity doppler ultrasound due to elevated D-dimer.  It was negative for DVT bilaterally.  Patient was evaluated by PT and OT, both recommended home health therapy.  This was arranged.  Patient is clinically improved and stable for discharge home.  Daughter at bedside and agreeable with plan.   Discharge Instructions   Discharge Instructions    Call MD for:  extreme fatigue   Complete by: As directed    Call MD for:  persistant dizziness or light-headedness   Complete by: As directed    Call MD for:  persistant nausea and vomiting   Complete by: As directed    Call MD for:  severe uncontrolled pain   Complete by: As directed    Call MD for:  temperature >100.4   Complete by: As  directed    Diet - low sodium heart healthy   Complete by: As directed    Discharge instructions   Complete by: As directed    You can take meclizine (aka Antivert) as needed for your vertigo / dizziness.  Continue working with PT and OT at home.  Be cautious getting up if you are dizzy, sit back down to avoid falling.  Consider making appointment at the Outpatient Rehab center here at [facility] for VESTIBULAR THERAPY (inner ear physical therapy), which may help resolve your vertigo.   Increase activity slowly   Complete by: As directed      Allergies as of 03/25/2020      Reactions   Augmentin [amoxicillin-pot Clavulanate] Nausea Only, Rash, Other (See Comments)   GI upset GI upset Has patient had a PCN reaction causing immediate rash, facial/tongue/throat swelling, SOB or lightheadedness with hypotension:No Has patient had a PCN reaction causing severe rash involving mucus membranes or skin necrosis:No Has patient had a PCN reaction that required hospitalization:No Has patient had a PCN reaction occurring within the last 10 years:Yes--upset stomach If all of the above answers are "NO", then may proceed with Cephalosporin use.   Oxycodone Other (See Comments)   Memory loss, medication is too strong.      Medication List    TAKE these medications   acetaminophen 500 MG tablet Commonly known as: TYLENOL Take 500 mg by mouth every 6 (six) hours as needed (for pain.). Gel cap   ALPRAZolam 0.5 MG tablet Commonly known as: XANAX Take 0.5 mg by mouth daily as needed.   ARIPiprazole 5 MG tablet Commonly known as: ABILIFY Take 5 mg by mouth daily.   calcium carbonate 1500 (600 Ca) MG Tabs tablet Commonly known as: OSCAL Take 600 mg by mouth daily with breakfast.   citalopram 40 MG tablet Commonly known as: CELEXA Take 40 mg by mouth daily.   cyanocobalamin 1000 MCG/ML injection Commonly known as: (VITAMIN B-12) Inject 1,000 mcg into the muscle every 30 (thirty) days.    docusate sodium 50 MG capsule Commonly known as: COLACE Take 100 mg by mouth daily.   ferrous sulfate 325 (65 FE) MG tablet Take 1 tablet by mouth daily.   ipratropium 0.06 % nasal spray Commonly known as: ATROVENT Place 2 sprays into the nose as needed.   Lumigan 0.01 % Soln Generic drug: bimatoprost Place 1 drop into both eyes at bedtime.   meclizine 25 MG tablet Commonly known as: ANTIVERT Take 1 tablet (25 mg total) by mouth 3 (three) times daily as needed for dizziness.   mirabegron ER 25 MG Tb24 tablet Commonly known as: MYRBETRIQ Take 1 tablet (25 mg total) daily by mouth.   Multi-Vitamin tablet Take 1 tablet by mouth daily.   pravastatin 20 MG tablet Commonly known as: PRAVACHOL Take 20 mg by mouth at bedtime.   quinapril-hydrochlorothiazide 20-12.5 MG tablet Commonly known as: ACCURETIC Take 1 tablet by mouth daily.   solifenacin 10 MG tablet Commonly known as: VESICARE Take 10 mg by mouth daily.   traMADol 50 MG tablet Commonly known as: ULTRAM Take 1-2 tablets (50-100 mg total) by mouth every 4 (  four) hours as needed for moderate pain.       Follow-up Information    [facility]. Schedule an appointment as soon as possible for a visit.   Specialty: Rehabilitation Why: for vestibular rehab specifically (has home health PT and OT) Contact information: [address] [id number] [place] [address] [phone]             Allergies  Allergen Reactions  . Augmentin [Amoxicillin-Pot Clavulanate] Nausea Only, Rash and Other (See Comments)    GI upset GI upset Has patient had a PCN reaction causing immediate rash, facial/tongue/throat swelling, SOB or lightheadedness with hypotension:No Has patient had a PCN reaction causing severe rash involving mucus membranes or skin necrosis:No Has patient had a PCN reaction that required hospitalization:No Has patient had a PCN reaction  occurring within the last 10 years:Yes--upset stomach If all of the above answers are "NO", then may proceed with Cephalosporin use.   Marland Kitchen Oxycodone Other (See Comments)    Memory loss, medication is too strong.    Consultations:  none   Procedures/Studies: DG Chest 2 View  Result Date: 03/24/2020 CLINICAL DATA:  Near syncope EXAM: CHEST - 2 VIEW COMPARISON:  08/28/2015 FINDINGS: The heart size and mediastinal contours are stable. Mild streaky bibasilar opacities, favor atelectasis. Chronically coarsened interstitial markings. Lungs are otherwise clear. No pleural effusion. No pneumothorax. Advanced degenerative changes of the right glenohumeral joint. IMPRESSION: Mild streaky bibasilar opacities, favor atelectasis. Electronically Signed   By: Duanne Guess D.O.   On: 03/24/2020 08:01   DG Lumbar Spine 2-3 Views  Result Date: 03/24/2020 CLINICAL DATA:  Low back pain after fall EXAM: LUMBAR SPINE - 2-3 VIEW COMPARISON:  02/05/2014 FINDINGS: Five lumbar type vertebral segments. Vertebral body heights and alignment are maintained. No fracture identified. Mild disc height loss most pronounced at L4-5. Multilevel degenerative endplate changes. Mild lower lumbar facet arthrosis. IMPRESSION: No fracture identified. Mild degenerative changes. Electronically Signed   By: Duanne Guess D.O.   On: 03/24/2020 11:17   DG Knee 2 Views Right  Result Date: 03/24/2020 CLINICAL DATA:  Right knee pain after fall. EXAM: RIGHT KNEE - 1-2 VIEW COMPARISON:  None. FINDINGS: No evidence of fracture, dislocation, or joint effusion. Moderate degenerative changes are seen involving the medial and lateral joint spaces, with osteophyte formation seen laterally. Soft tissues are unremarkable. IMPRESSION: Moderate degenerative joint disease. No acute abnormality seen in the right knee. Electronically Signed   By: Lupita Raider M.D.   On: 03/24/2020 11:16   CT Head Wo Contrast  Result Date: 03/24/2020 CLINICAL  DATA:  Dizziness, nonspecific. Neck trauma. Additional provided: Fall. EXAM: CT HEAD WITHOUT CONTRAST CT CERVICAL SPINE WITHOUT CONTRAST TECHNIQUE: Multidetector CT imaging of the head and cervical spine was performed following the standard protocol without intravenous contrast. Multiplanar CT image reconstructions of the cervical spine were also generated. COMPARISON:  Head CT 01/23/2020. FINDINGS: CT HEAD FINDINGS Brain: Stable, mild generalized parenchymal atrophy. Stable, mild ill-defined hypoattenuation within the cerebral white matter which is nonspecific, but consistent with chronic small vessel ischemic disease. Redemonstrated small chronic lacunar infarct within the left caudate head. Nonspecific subcentimeter focus of parenchymal calcification within the anterior right frontal lobe periventricular white matter (series 2, image 15). There is no acute intracranial hemorrhage. No demarcated cortical infarct. No extra-axial fluid collection. No evidence of intracranial mass. No midline shift. Vascular: No hyperdense vessel.  Atherosclerotic calcifications. Skull: Normal. Negative for fracture or focal lesion. Sinuses/Orbits: Visualized  orbits show no acute finding. No significant paranasal sinus disease or mastoid effusion at the imaged levels. CT CERVICAL SPINE FINDINGS Alignment: Mild nonspecific reversal of the expected cervical lordosis. Trace C2-C3 grade 1 anterolisthesis. 2 mm C4-C5 grade 1 anterolisthesis. Trace C7-T1 grade 1 anterolisthesis. Skull base and vertebrae: The basion-dental and atlanto-dental intervals are maintained.No evidence of acute fracture to the cervical spine. Well corticated chronic appearing deformity of the posterosuperior aspect of the C4 vertebral body (series 4, image 24). Soft tissues and spinal canal: No prevertebral fluid or swelling. No visible canal hematoma. Disc levels: Cervical spondylosis with multilevel disc space narrowing, disc bulges, posterior disc osteophytes,  uncovertebral hypertrophy and facet arthrosis. No high-grade bony spinal canal stenosis. Multilevel bony neural foraminal narrowing. Upper chest: No consolidation within the imaged lung apices. No visible pneumothorax. IMPRESSION: CT head: 1. No evidence of acute intracranial abnormality. 2. Stable, mild generalized atroaphy of the brain and chronic small vessel ischemic disease. Redemonstrated chronic left basal ganglia lacunar infarct. 3. Nonspecific subcentimeter focus of parenchymal calcification within the anterior right frontal lobe periventricular white matter, unchanged as compared to the head CT of 01/23/2020. CT cervical spine: 1. No evidence of acute fracture to the cervical spine. 2. Nonspecific reversal of the expected cervical lordosis. 3. Mild grade 1 anterolisthesis at C2-C3, C4-C5 and C7-T1 as detailed. 4. Cervical spondylosis as described. Electronically Signed   By: Jackey Loge DO   On: 03/24/2020 11:07   CT Cervical Spine Wo Contrast  Result Date: 03/24/2020 CLINICAL DATA:  Dizziness, nonspecific. Neck trauma. Additional provided: Fall. EXAM: CT HEAD WITHOUT CONTRAST CT CERVICAL SPINE WITHOUT CONTRAST TECHNIQUE: Multidetector CT imaging of the head and cervical spine was performed following the standard protocol without intravenous contrast. Multiplanar CT image reconstructions of the cervical spine were also generated. COMPARISON:  Head CT 01/23/2020. FINDINGS: CT HEAD FINDINGS Brain: Stable, mild generalized parenchymal atrophy. Stable, mild ill-defined hypoattenuation within the cerebral white matter which is nonspecific, but consistent with chronic small vessel ischemic disease. Redemonstrated small chronic lacunar infarct within the left caudate head. Nonspecific subcentimeter focus of parenchymal calcification within the anterior right frontal lobe periventricular white matter (series 2, image 15). There is no acute intracranial hemorrhage. No demarcated cortical infarct. No  extra-axial fluid collection. No evidence of intracranial mass. No midline shift. Vascular: No hyperdense vessel.  Atherosclerotic calcifications. Skull: Normal. Negative for fracture or focal lesion. Sinuses/Orbits: Visualized orbits show no acute finding. No significant paranasal sinus disease or mastoid effusion at the imaged levels. CT CERVICAL SPINE FINDINGS Alignment: Mild nonspecific reversal of the expected cervical lordosis. Trace C2-C3 grade 1 anterolisthesis. 2 mm C4-C5 grade 1 anterolisthesis. Trace C7-T1 grade 1 anterolisthesis. Skull base and vertebrae: The basion-dental and atlanto-dental intervals are maintained.No evidence of acute fracture to the cervical spine. Well corticated chronic appearing deformity of the posterosuperior aspect of the C4 vertebral body (series 4, image 24). Soft tissues and spinal canal: No prevertebral fluid or swelling. No visible canal hematoma. Disc levels: Cervical spondylosis with multilevel disc space narrowing, disc bulges, posterior disc osteophytes, uncovertebral hypertrophy and facet arthrosis. No high-grade bony spinal canal stenosis. Multilevel bony neural foraminal narrowing. Upper chest: No consolidation within the imaged lung apices. No visible pneumothorax. IMPRESSION: CT head: 1. No evidence of acute intracranial abnormality. 2. Stable, mild generalized atroaphy of the brain and chronic small vessel ischemic disease. Redemonstrated chronic left basal ganglia lacunar infarct. 3. Nonspecific subcentimeter focus of parenchymal calcification within the anterior right frontal lobe periventricular white matter, unchanged  as compared to the head CT of 01/23/2020. CT cervical spine: 1. No evidence of acute fracture to the cervical spine. 2. Nonspecific reversal of the expected cervical lordosis. 3. Mild grade 1 anterolisthesis at C2-C3, C4-C5 and C7-T1 as detailed. 4. Cervical spondylosis as described. Electronically Signed   By: Jackey Loge DO   On: 03/24/2020  11:07   MR Brain W and Wo Contrast  Result Date: 03/24/2020 CLINICAL DATA:  Dizziness, nonspecific. Additional history provided: Fall last night hitting head, intermittent hallucinations today, patient unable to sleep, history of prior CVA. EXAM: MRI HEAD WITHOUT AND WITH CONTRAST TECHNIQUE: Multiplanar, multiecho pulse sequences of the brain and surrounding structures were obtained without and with intravenous contrast. CONTRAST:  40mL GADAVIST GADOBUTROL 1 MMOL/ML IV SOLN COMPARISON:  Head CT 03/24/2020, brain MRI 12/05/2018. FINDINGS: Brain: Mild generalized cerebral atrophy. Redemonstrated chronic lacunar infarct within the left caudate head (series 10, image 16). Mild multifocal T2/FLAIR hyperintensity within the cerebral white matter which is nonspecific, but consistent with chronic small vessel ischemic disease. A nonspecific subcentimeter focus chronic parenchymal calcification within the anterior right frontal lobe periventricular white matter was better appreciated on the same-day head CT. Two punctate enhancing foci are demonstrated within the brainstem, 1 within the paramedian right pons (series 18, image 59) and the other within the right midbrain (series 19, image 15) (series 20, image 11). There is no acute infarct. No chronic intracranial blood products. No extra-axial fluid collection. No midline shift. Vascular: Expected proximal arterial flow voids. Skull and upper cervical spine: No focal marrow lesion. Sinuses/Orbits: Visualized orbits show no acute finding. Mild ethmoid and maxillary sinus mucosal thickening. No significant mastoid effusion. IMPRESSION: No evidence of acute or recent subacute infarction. There are two punctate enhancing foci within the brainstem, one within the paramedian right pons and the other within the right midbrain. These foci may be vascular/benign in etiology. However, short-interval 3-6 month contrast-enhanced MRI follow-up is recommended to ensure stability and  exclude alternative etiologies. Mild generalized cerebral atrophy and chronic small vessel ischemic disease, stable as compared to the brain MRI of 12/05/2018. Redemonstrated chronic lacunar infarct within the left caudate nucleus. A nonspecific subcentimeter focus of chronic calcification within the anterior right frontal lobe periventricular white matter was better appreciated on the same-day head CT. Mild ethmoid and maxillary sinus mucosal thickening. Electronically Signed   By: Jackey Loge DO   On: 03/24/2020 16:58   US Venous Img Lower Bilateral (DVT)  Result Date: 03/24/2020 CLINICAL DATA:  Positive D-dimer and leg pain EXAM: BILATERAL LOWER EXTREMITY VENOUS DOPPLER ULTRASOUND TECHNIQUE: Gray-scale sonography with graded compression, as well as color Doppler and duplex ultrasound were performed to evaluate the lower extremity deep venous systems from the level of the common femoral vein and including the common femoral, femoral, profunda femoral, popliteal and calf veins including the posterior tibial, peroneal and gastrocnemius veins when visible. The superficial great saphenous vein was also interrogated. Spectral Doppler was utilized to evaluate flow at rest and with distal augmentation maneuvers in the common femoral, femoral and popliteal veins. COMPARISON:  None. FINDINGS: RIGHT LOWER EXTREMITY Common Femoral Vein: No evidence of thrombus. Normal compressibility, respiratory phasicity and response to augmentation. Saphenofemoral Junction: No evidence of thrombus. Normal compressibility and flow on color Doppler imaging. Profunda Femoral Vein: No evidence of thrombus. Normal compressibility and flow on color Doppler imaging. Femoral Vein: No evidence of thrombus. Normal compressibility, respiratory phasicity and response to augmentation. Popliteal Vein: No evidence of thrombus. Normal compressibility, respiratory phasicity  and response to augmentation. Calf Veins: No evidence of thrombus. Normal  compressibility and flow on color Doppler imaging. Superficial Great Saphenous Vein: No evidence of thrombus. Normal compressibility. Venous Reflux:  None. Other Findings: Popliteal cyst is noted measuring 3.6 cm in greatest dimension. LEFT LOWER EXTREMITY Common Femoral Vein: No evidence of thrombus. Normal compressibility, respiratory phasicity and response to augmentation. Saphenofemoral Junction: No evidence of thrombus. Normal compressibility and flow on color Doppler imaging. Profunda Femoral Vein: No evidence of thrombus. Normal compressibility and flow on color Doppler imaging. Femoral Vein: No evidence of thrombus. Normal compressibility, respiratory phasicity and response to augmentation. Popliteal Vein: No evidence of thrombus. Normal compressibility, respiratory phasicity and response to augmentation. Calf Veins: No evidence of thrombus. Normal compressibility and flow on color Doppler imaging. Superficial Great Saphenous Vein: No evidence of thrombus. Normal compressibility. Venous Reflux:  None. Other Findings:  None. IMPRESSION: No evidence of deep venous thrombosis in either lower extremity. Right-sided popliteal cyst. Electronically Signed   By: Alcide Clever M.D.   On: 03/24/2020 21:45       Subjective: Pt seen with daughter at bedside. She reports dizziness is improved.  It does tend to occur with changes in body or head position, and she describes room spinning sensation.  Endorses prior history of vertigo and use of meclizine which was helpful as she recalled.  Patient and daughter agreeable to trial of meclizine.  Patient denies other acute complaints including chest pain, palpitations, shortness of breath, fever/chills..     Discharge Exam: Vitals:   03/25/20 1300 03/25/20 1400  BP: 111/61 112/60  Pulse: 64 65  Resp:  16  Temp:    SpO2: 93% 96%   Vitals:   03/25/20 1200 03/25/20 1230 03/25/20 1300 03/25/20 1400  BP: 114/60 (!) 113/54 111/61 112/60  Pulse: 63 64 64 65   Resp:    16  Temp:      TempSrc:      SpO2: 93% 91% 93% 96%  Weight:      Height:        General: Pt is alert, awake, not in acute distress Cardiovascular: RRR, S1/S2 +, no rubs, no gallops Respiratory: CTA bilaterally, no wheezing, no rhonchi Abdominal: Soft, NT, ND, bowel sounds + Extremities: no edema, no cyanosis    The results of significant diagnostics from this hospitalization (including imaging, microbiology, ancillary and laboratory) are listed below for reference.     Microbiology: No results found for this or any previous visit (from the past 240 hour(s)).   Labs: BNP (last 3 results) Recent Labs    03/24/20 1035  BNP 33.4   Basic Metabolic Panel: No results for input(s): NA, K, CL, CO2, GLUCOSE, BUN, CREATININE, CALCIUM, MG, PHOS in the last 168 hours. Liver Function Tests: No results for input(s): AST, ALT, ALKPHOS, BILITOT, PROT, ALBUMIN in the last 168 hours. No results for input(s): LIPASE, AMYLASE in the last 168 hours. No results for input(s): AMMONIA in the last 168 hours. CBC: No results for input(s): WBC, NEUTROABS, HGB, HCT, MCV, PLT in the last 168 hours. Cardiac Enzymes: No results for input(s): CKTOTAL, CKMB, CKMBINDEX, TROPONINI in the last 168 hours. BNP: Invalid input(s): POCBNP CBG: No results for input(s): GLUCAP in the last 168 hours. D-Dimer No results for input(s): DDIMER in the last 72 hours. Hgb A1c No results for input(s): HGBA1C in the last 72 hours. Lipid Profile No results for input(s): CHOL, HDL, LDLCALC, TRIG, CHOLHDL, LDLDIRECT in the last 72 hours. Thyroid function  studies No results for input(s): TSH, T4TOTAL, T3FREE, THYROIDAB in the last 72 hours.  Invalid input(s): FREET3 Anemia work up No results for input(s): VITAMINB12, FOLATE, FERRITIN, TIBC, IRON, RETICCTPCT in the last 72 hours. Urinalysis    Component Value Date/Time   COLORURINE YELLOW (A) 03/23/2020 1422   APPEARANCEUR HAZY (A) 03/23/2020 1422    APPEARANCEUR Clear 03/29/2017 1352   LABSPEC 1.025 03/23/2020 1422   PHURINE 5.0 03/23/2020 1422   GLUCOSEU NEGATIVE 03/23/2020 1422   HGBUR NEGATIVE 03/23/2020 1422   BILIRUBINUR NEGATIVE 03/23/2020 1422   BILIRUBINUR Negative 03/29/2017 1352   KETONESUR NEGATIVE 03/23/2020 1422   PROTEINUR NEGATIVE 03/23/2020 1422   NITRITE NEGATIVE 03/23/2020 1422   LEUKOCYTESUR NEGATIVE 03/23/2020 1422   Sepsis Labs Invalid input(s): PROCALCITONIN,  WBC,  LACTICIDVEN Microbiology No results found for this or any previous visit (from the past 240 hour(s)).   Time coordinating discharge: Over 30 minutes  SIGNED:   Pennie BanterKelly A Treyden Hakim, DO [facility] 04/06/2020, 8:21 PM   If 7PM-7AM, please contact night-coverage [email or web address]

## 2020-03-25 NOTE — Evaluation (Signed)
Occupational Therapy Evaluation Patient Details Name: Sarah Moon MRN: [id number] DOB: [birth year] Today's Date: 03/25/2020    History of Present Illness 86 y.o. female with a past medical history of dementia, anxiety, B12 deficiency, HDL, HTN, peripheral neuropathy, GERD, and glaucoma who presents accompanied by her son after a syncopal episode that occurred yesterday and was associate with a fall.  Patient states she has had dizzy spells for some time but states that yesterday she felt significant more dizzy than usual while walking in her home and fell striking the right side of her face.  She does not think she lost consciousness.  Denies urinary incontinence or tongue biting.  She states she was too weak to get up and her son states that when he found her it was approximately 45 minutes after she pressed her life alert button. Imaging negative for acute infarct and negative for DVT.   Clinical Impression   Pt was seen for OT/PT co-evaluation and treat this date. Prior to hospital admission, pt was living by herself with occasional assist from son and daughter for transportation and med mgt. Pt lives on the main floor of a 2 story home with 3+1 steps to enter and bilat rails. Dtr in from [place] but reports she will stay with pt "as long as is needed." Pt pleasant, alert, and oriented x4, follows commands well. Currently pt demonstrates impairments as described below (See OT problem list) which functionally limit her ability to perform ADL/self-care tasks. Pt currently requires MIN A for LB ADL, CGA for ADL mobility with RW. Pt would benefit from skilled OT services to address noted impairments and functional limitations (see below for any additional details) in order to maximize safety and independence while minimizing falls risk and caregiver burden. Upon hospital discharge, recommend 24/7 supervision/assist and HHOT to maximize pt safety and return to functional independence during meaningful  occupations of daily life. Pt/dtr agreeable. RN/MD notified.     Follow Up Recommendations  Home health OT;Supervision/Assistance - 24 hour    Equipment Recommendations  Other (comment) (consider AE for LB ADL)    Recommendations for Other Services       Precautions / Restrictions Precautions Precautions: Fall Restrictions Weight Bearing Restrictions: No      Mobility Bed Mobility Overal bed mobility: Needs Assistance Bed Mobility: Supine to Sit;Sit to Supine           General bed mobility comments: increased effort to perform, assist per PT  Transfers Overall transfer level: Needs assistance Equipment used: Rolling walker (2 wheeled) Transfers: Sit to/from Stand Sit to Stand: From elevated surface         General transfer comment: assist per PT    Balance Overall balance assessment: Needs assistance Sitting-balance support: Feet unsupported;No upper extremity supported Sitting balance-Leahy Scale: Fair     Standing balance support: Bilateral upper extremity supported;During functional activity Standing balance-Leahy Scale: Fair                             ADL either performed or assessed with clinical judgement   ADL Overall ADL's : Needs assistance/impaired Eating/Feeding: Independent                   Lower Body Dressing: Sit to/from stand;Minimal assistance Lower Body Dressing Details (indicate cue type and reason): Min A to initiate threading of briefs over feet, pt able to complete donning over hips with CGA for standing/safety; pt/dtr endorse difficulty more  recently prior to admission for this ADL and sometimes takes pt 15+minutes to perform at which point she is fatigued   Statistician Details (indicate cue type and reason): anticipate CGA with RW for elevated commode 2/2 performance during assessment         Functional mobility during ADLs: Min guard;Rolling walker       Vision Baseline Vision/History: Wears  glasses Wears Glasses: Reading only Patient Visual Report: No change from baseline       Perception     Praxis      Pertinent Vitals/Pain Pain Assessment: No/denies pain     Hand Dominance Right   Extremity/Trunk Assessment Upper Extremity Assessment Upper Extremity Assessment: Overall WFL for tasks assessed   Lower Extremity Assessment Lower Extremity Assessment: RLE deficits/detail;Defer to PT evaluation RLE Deficits / Details: hx R knee cyst, DJD per chart       Communication Communication Communication: HOH (R>L)   Cognition Arousal/Alertness: Awake/alert Behavior During Therapy: WFL for tasks assessed/performed Overall Cognitive Status: Within Functional Limits for tasks assessed                                 General Comments: alert and oriented, follows commands with occasional cues   General Comments  orthostatic vitals taken (see Vital Signs section for detail); pt not orthostatic but did endorse slight dizziness with positional changes that improved with time    Exercises Other Exercises Other Exercises: Pt/dtr educated in role of OT, addressed pt/dtr's current goals for pt, safety, STS LB dressing Other Exercises: Functional mobility with RW, monitoring vitals throughout; HR up to 133 with exertion   Shoulder Instructions      Home Living Family/patient expects to be discharged to:: Private residence Living Arrangements: Alone Available Help at Discharge: Family;Available 24 hours/day (daughter from [place] plans to stay with pt "as long as is needed") Type of Home: House Home Access: Stairs to enter [employer] of Steps: 3+1 Entrance Stairs-Rails: Right;Left;Can reach both Home Layout: Two level;Able to live on main level with bedroom/bathroom     Bathroom Shower/Tub: Walk-in shower;Door   Bathroom Toilet: Handicapped height (BSC over toilet)     Home Equipment: Cane - single point;Walker - standard;Shower seat           Prior Functioning/Environment Level of Independence: Needs assistance  Gait / Transfers Assistance Needed: ambulating with SPC for stability ADL's / Homemaking Assistance Needed: Indep with ADL however has difficulty with LB dressing, wears Depends for bladder mgt and uses BSC beside bed at night for toileting; Son/dtr assist with med mgt (set up in weekly pill box), transportation   Comments: Pt endorses 2 falls in past 12 mo; pt reports this fall she "probably got up too fast"        OT Problem List: Cardiopulmonary status limiting activity;Decreased strength;Decreased activity tolerance;Impaired balance (sitting and/or standing);Decreased knowledge of use of DME or AE;Decreased safety awareness      OT Treatment/Interventions: Therapeutic exercise;Self-care/ADL training;Therapeutic activities;DME and/or AE instruction;Patient/family education;Balance training;Energy conservation    OT Goals(Current goals can be found in the care plan section) Acute Rehab OT Goals Patient Stated Goal: go home OT Goal Formulation: With patient/family Time For Goal Achievement: 04/08/20 Potential to Achieve Goals: Good ADL Goals Pt Will Perform Lower Body Dressing: with supervision;sit to/from stand;with adaptive equipment Pt Will Transfer to Toilet: with supervision;ambulating;bedside commode (LRAD for amb) Additional ADL Goal #1: Pt/family will verbalize plan to  implement at least 1 learned falls prevention strategy.  OT Frequency: Min 1X/week   Barriers to D/C:            Co-evaluation PT/OT/SLP Co-Evaluation/Treatment: Yes   PT goals addressed during session: Mobility/safety with mobility;Balance;Proper use of DME OT goals addressed during session: ADL's and self-care      AM-PAC OT "6 Clicks" Daily Activity     Outcome Measure Help from another person eating meals?: None Help from another person taking care of personal grooming?: None Help from another person toileting, which includes  using toliet, bedpan, or urinal?: A Little Help from another person bathing (including washing, rinsing, drying)?: A Little Help from another person to put on and taking off regular upper body clothing?: None Help from another person to put on and taking off regular lower body clothing?: A Little 6 Click Score: 21   End of Session Equipment Utilized During Treatment: Gait belt;Rolling walker Nurse Communication: Mobility status;Other (comment) (HHOT/PT recommendation, not orthostatic)  Activity Tolerance: Patient tolerated treatment well Patient left: in bed;with call bell/phone within reach;with family/visitor present  OT Visit Diagnosis: Other abnormalities of gait and mobility (R26.89);History of falling (Z91.81)                Time: 8841-6606 OT Time Calculation (min): 42 min Charges:  OT General Charges $OT Visit: 1 Visit OT Evaluation $OT Eval Moderate Complexity: 1 Mod OT Treatments $Self Care/Home Management : 8-22 mins  Richrd Prime, MPH, MS, OTR/L ascom [phone] 03/25/20, 10:47 AM

## 2020-03-25 NOTE — ED Notes (Signed)
Pt sleeping, VSS, NAD noted. Daughter bedside.

## 2020-03-25 NOTE — Evaluation (Signed)
Physical Therapy Evaluation Patient Details Name: Sarah Moon MRN: [id number] DOB: 03-04-1926 Today's Date: 03/25/2020   History of Present Illness  86 y.o. female with a past medical history of dementia, anxiety, B12 deficiency, HDL, HTN, peripheral neuropathy, GERD, and glaucoma who presents accompanied by her son after a syncopal episode that occurred yesterday and was associate with a fall.  Patient states she has had dizzy spells for some time but states that yesterday she felt significant more dizzy than usual while walking in her home and fell striking the right side of her face.  She does not think she lost consciousness.  Denies urinary incontinence or tongue biting.  She states she was too weak to get up and her son states that when he found her it was approximately 45 minutes after she pressed her life alert button. Imaging negative for acute infarct and negative for DVT.  Clinical Impression  Pt is a pleasant 86 year old female who was admitted for a fall. Co-eval done with OT. Pt performs bed mobility, transfers, and ambulation with cga and RW. Pt demonstrates deficits with strength/mobility. Orthostatics obtained, negative at this time. No further dizziness. Does need a RW at discharge and educated on benefits of mobility. Would benefit from skilled PT to address above deficits and promote optimal return to PLOF. Recommend transition to HHPT upon discharge from acute hospitalization.     Follow Up Recommendations Home health PT;Supervision/Assistance - 24 hour    Equipment Recommendations  Rolling walker with 5" wheels    Recommendations for Other Services       Precautions / Restrictions Precautions Precautions: Fall Restrictions Weight Bearing Restrictions: No      Mobility  Bed Mobility Overal bed mobility: Needs Assistance Bed Mobility: Supine to Sit;Sit to Supine     Supine to sit: Min guard Sit to supine: Min assist   General bed mobility comments: takes  increased time to perform. No dizziness noted  Transfers Overall transfer level: Needs assistance Equipment used: Rolling walker (2 wheeled) Transfers: Sit to/from Stand Sit to Stand: Min guard;From elevated surface         General transfer comment: Safe technique. Once standing, slight post leaning noted.  Ambulation/Gait Ambulation/Gait assistance: Min guard Gait Distance (Feet): 75 Feet Assistive device: Rolling walker (2 wheeled) Gait Pattern/deviations: Step-through pattern     General Gait Details: ambulated around room, able to perform head turns without dizziness. No LOB noted. O2 WNL on RA and HR increases to 133HR with exertion  Stairs            Wheelchair Mobility    Modified Rankin (Stroke Patients Only)       Balance Overall balance assessment: Needs assistance Sitting-balance support: Feet unsupported;No upper extremity supported Sitting balance-Leahy Scale: Fair     Standing balance support: Bilateral upper extremity supported;During functional activity Standing balance-Leahy Scale: Fair Standing balance comment: slight post lean                             Pertinent Vitals/Pain Pain Assessment: No/denies pain    Home Living Family/patient expects to be discharged to:: Private residence Living Arrangements: Alone Available Help at Discharge: Family;Available 24 hours/day (daughter from [place] plans to stay "as long as needed") Type of Home: House Home Access: Stairs to enter Entrance Stairs-Rails: Right;Left;Can reach both Entrance Stairs-Number of Steps: 3+1 Home Layout: Two level;Able to live on main level with bedroom/bathroom Home Equipment: Cane - single  point;Walker - standard;Shower seat Additional Comments: Pt reports her son lives locally and checks on her daily. Daughter from [place]    Prior Function Level of Independence: Needs assistance   Gait / Transfers Assistance Needed: ambulating with SPC for stability  ADL's /  Homemaking Assistance Needed: Indep with ADL however has difficulty with LB dressing, wears Depends for bladder mgt and uses BSC beside bed at night for toileting; Son/dtr assist with med mgt (set up in weekly pill box), transportation  Comments: Pt endorses 2 falls in past 12 mo; pt reports this fall she "probably got up too fast". currently receiving HHPT     Hand Dominance   Dominant Hand: Right    Extremity/Trunk Assessment   Upper Extremity Assessment Upper Extremity Assessment: Overall WFL for tasks assessed    Lower Extremity Assessment Lower Extremity Assessment: Generalized weakness (R LE grossly 4/5 pain to R knee. L LE grossly 5/5) RLE Deficits / Details: hx R knee cyst, DJD per chart       Communication   Communication: HOH  Cognition Arousal/Alertness: Awake/alert Behavior During Therapy: WFL for tasks assessed/performed Overall Cognitive Status: Within Functional Limits for tasks assessed                                 General Comments: alert and oriented, follows commands with occasional cues      General Comments General comments (skin integrity, edema, etc.): orthostatic vitals taken (see Vital Signs section for detail); pt not orthostatic but did endorse slight dizziness with positional changes that improved with time    Exercises Other Exercises Other Exercises: Pt/dtr educated in role of OT, addressed pt/dtr's current goals for pt, safety, STS LB dressing Other Exercises: Functional mobility with RW, monitoring vitals throughout; HR up to 133 with exertion   Assessment/Plan    PT Assessment Patient needs continued PT services  PT Problem List Decreased strength;Decreased balance;Decreased mobility       PT Treatment Interventions DME instruction;Gait training;Therapeutic exercise;Balance training    PT Goals (Current goals can be found in the Care Plan section)  Acute Rehab PT Goals Patient Stated Goal: go home PT Goal Formulation:  With patient Time For Goal Achievement: 04/08/20 Potential to Achieve Goals: Good    Frequency Min 2X/week   Barriers to discharge        Co-evaluation PT/OT/SLP Co-Evaluation/Treatment: Yes Reason for Co-Treatment: For patient/therapist safety;To address functional/ADL transfers PT goals addressed during session: Mobility/safety with mobility;Balance;Strengthening/ROM OT goals addressed during session: ADL's and self-care       AM-PAC PT "6 Clicks" Mobility  Outcome Measure Help needed turning from your back to your side while in a flat bed without using bedrails?: None Help needed moving from lying on your back to sitting on the side of a flat bed without using bedrails?: A Little Help needed moving to and from a bed to a chair (including a wheelchair)?: A Little Help needed standing up from a chair using your arms (e.g., wheelchair or bedside chair)?: A Little Help needed to walk in hospital room?: A Little Help needed climbing 3-5 steps with a railing? : A Little 6 Click Score: 19    End of Session Equipment Utilized During Treatment: Gait belt Activity Tolerance: Patient tolerated treatment well Patient left: in bed Nurse Communication: Mobility status PT Visit Diagnosis: Muscle weakness (generalized) (M62.81);Difficulty in walking, not elsewhere classified (R26.2);Unsteadiness on feet (R26.81)    Time: 7510-2585  PT Time Calculation (min) (ACUTE ONLY): 42 min   Charges:   PT Evaluation $PT Eval Moderate Complexity: 1 Mod PT Treatments $Gait Training: 8-22 mins        Elizabeth Palau, PT, DPT [phone]   Enrico Eaddy 03/25/2020, 11:39 AM

## 2020-03-25 NOTE — Progress Notes (Signed)
PHARMACIST - PHYSICIAN COMMUNICATION  CONCERNING:  Enoxaparin (Lovenox) for DVT Prophylaxis    RECOMMENDATION: Patient was prescribed enoxaparin 30 mg q24 hours for VTE prophylaxis.   Filed Weights   03/23/20 2125  Weight: 78.5 kg (173 lb)    Body mass index is 30.65 kg/m.  Estimated Creatinine Clearance: 33.9 mL/min (A) (by C-G formula based on SCr of 1.05 mg/dL (H)).   Patient is candidate for enoxaparin 40 mg every 24 hours based on CrCl > 28ml/min and Weight > 45kg  DESCRIPTION: Pharmacy has adjusted enoxaparin dose per [facility] policy.  Patient is now receiving enoxaparin 40 mg every 24 hours    Tressie Ellis 03/25/2020 7:28 AM

## 2020-03-25 NOTE — ED Notes (Signed)
Pt repositioned in bed at thsi time

## 2020-10-15 ENCOUNTER — Other Ambulatory Visit: Payer: Self-pay | Admitting: Neurology

## 2020-10-15 DIAGNOSIS — G500 Trigeminal neuralgia: Secondary | ICD-10-CM

## 2020-10-15 DIAGNOSIS — H8110 Benign paroxysmal vertigo, unspecified ear: Secondary | ICD-10-CM

## 2020-10-20 ENCOUNTER — Other Ambulatory Visit: Payer: Self-pay | Admitting: Neurology

## 2020-10-20 DIAGNOSIS — R519 Headache, unspecified: Secondary | ICD-10-CM

## 2020-10-20 DIAGNOSIS — R930 Abnormal findings on diagnostic imaging of skull and head, not elsewhere classified: Secondary | ICD-10-CM

## 2020-10-20 DIAGNOSIS — G500 Trigeminal neuralgia: Secondary | ICD-10-CM

## 2020-10-20 DIAGNOSIS — R9389 Abnormal findings on diagnostic imaging of other specified body structures: Secondary | ICD-10-CM

## 2020-10-20 DIAGNOSIS — R251 Tremor, unspecified: Secondary | ICD-10-CM

## 2020-10-20 DIAGNOSIS — W19XXXD Unspecified fall, subsequent encounter: Secondary | ICD-10-CM

## 2020-10-20 DIAGNOSIS — R42 Dizziness and giddiness: Secondary | ICD-10-CM

## 2020-10-23 ENCOUNTER — Ambulatory Visit: Payer: Medicare Other

## 2020-10-30 ENCOUNTER — Ambulatory Visit
Admission: RE | Admit: 2020-10-30 | Discharge: 2020-10-30 | Disposition: A | Discharge status: Home / Self Care | Payer: Medicare Other | Source: Ambulatory Visit | Attending: Neurology | Admitting: Neurology

## 2020-10-30 ENCOUNTER — Other Ambulatory Visit: Payer: Self-pay

## 2020-10-30 DIAGNOSIS — G500 Trigeminal neuralgia: Secondary | ICD-10-CM

## 2020-10-30 DIAGNOSIS — R930 Abnormal findings on diagnostic imaging of skull and head, not elsewhere classified: Secondary | ICD-10-CM | POA: Diagnosis present

## 2020-10-30 MED ORDER — GADOBUTROL 1 MMOL/ML IV SOLN
7.5000 mL | Freq: Once | INTRAVENOUS | Status: AC | PRN
  Administered 2020-10-30: 7.5 mL via INTRAVENOUS

## 2021-03-17 DIAGNOSIS — R600 Localized edema: Secondary | ICD-10-CM | POA: Insufficient documentation

## 2022-12-30 ENCOUNTER — Other Ambulatory Visit: Payer: Self-pay | Admitting: Neurology

## 2022-12-30 DIAGNOSIS — R519 Headache, unspecified: Secondary | ICD-10-CM

## 2022-12-30 DIAGNOSIS — G309 Alzheimer's disease, unspecified: Secondary | ICD-10-CM

## 2023-01-03 ENCOUNTER — Ambulatory Visit
Admission: RE | Admit: 2023-01-03 | Discharge: 2023-01-03 | Disposition: A | Discharge status: Home / Self Care | Payer: Medicare Other | Source: Ambulatory Visit | Attending: Neurology | Admitting: Neurology

## 2023-01-03 DIAGNOSIS — G309 Alzheimer's disease, unspecified: Secondary | ICD-10-CM | POA: Diagnosis not present

## 2023-01-03 DIAGNOSIS — R519 Headache, unspecified: Secondary | ICD-10-CM | POA: Diagnosis present

## 2023-01-03 DIAGNOSIS — F0280 Dementia in other diseases classified elsewhere without behavioral disturbance: Secondary | ICD-10-CM | POA: Insufficient documentation

## 2023-01-03 DIAGNOSIS — F0150 Vascular dementia without behavioral disturbance: Secondary | ICD-10-CM | POA: Insufficient documentation

## 2023-11-02 NOTE — Progress Notes (Signed)
 Sarah Moon is a 89 y.o. here for Medicare Wellness Visit  MEDICARE WELLNESS VISIT  Providers Rendering Care 1. Dr. Reyes Costa (PCP) Pt in NAD. HTN stable on meds. Has HLD on statin and anxiety on SSRI and Xanax . Also with CKD. Weight stable. B12 is low. Other labs stable. Sleeping well. Feels OK. No fever. Deneis CP or SOB. No palpitations. No change in bowels or bladder.  Functional Assessment (1) Hearing: Demonstrates no difficulty in hearing during normal conversation (2) Risk of Falls: Patient denies any falls or near falls in the last year, Gait steady without assistance during walk from waiting area to exam room (3) Home Safety: Patient feels secure in their home, There are operational smoke alarms in multiple areas of the home (4) Activities of Daily Living: has help with personal grooming and household chores, including cooking, cleaning and laundry. Family Manages Personal finances.  Depression Screening PHQ 2/9 last 3 flowsheet values     06/12/2021   10:35 AM 09/11/2021    3:49 PM 09/11/2021    4:09 PM  PHQ-2/9 Depression Screening   (OBSOLETE) Little interest or pleasure in doing things 0 0 0  (OBSOLETE) Feeling down, depressed, or hopeless (or irritable for Teens only)? 0 0 0  (OBSOLETE) Total Prescreening Score 0 0 0  (OBSOLETE) Total Score = 0 0 0     Depression Severity and Treatment Recommendations:  0-4= None  5-9= Mild / Treatment: Support, educate to call if worse; return in one month  10-14= Moderate / Treatment: Support, watchful waiting; Antidepressant or Psychotherapy  15-19= Moderately severe / Treatment: Antidepressant OR Psychotherapy  >= 20 = Major depression, severe / Antidepressant AND Psychotherapy   Cognitive Impairment Patient has episodes of loosing things, being forgetful. Seems oriented to person, place and time.  Responses appear appropriate and timely to this observer.  PREVENTION PLAN  Cardiovascular: FLP assessed  LDL=71 Diabetes: A1c or FBG assessed A1c=4.9 Glaucoma: N/A Hepatitis B (HBV) Vaccine:  Not Applicable Smoking Cessation:  Not Applicable  Other Personalized Health Advice  Encouraged patient to exercise 5 days a week, walking, water aerobics, gentle stretching recommended. Increase dietary intake of fresh fruits and vegetables, reduce red meat to twice a week.  End of Life Counseling Patient has living will in place; POA - ; Full Code  Current Outpatient Medications  Medication Sig Dispense Refill  . ALPRAZolam  (XANAX ) 0.5 MG tablet Take 1 tablet (0.5 mg total) by mouth at bedtime as needed for Sleep 30 tablet 2  . aspirin 81 MG EC tablet Take 81 mg by mouth every other day    . citalopram  (CELEXA ) 40 MG tablet TAKE 1 TABLET BY MOUTH EVERY DAY 90 tablet 1  . clotrimazole -betamethasone (LOTRISONE) 1-0.05 % cream Apply topically 2 (two) times daily 30 g 3  . diclofenac (VOLTAREN) 50 MG EC tablet Take 50 mg by mouth as needed    . fluticasone  propionate (FLONASE ) 50 mcg/actuation nasal spray INSTILL 2 SPRAYS INTO EACH NOSTRIL EVERY DAY    . lisinopriL  (ZESTRIL ) 20 MG tablet TAKE 1 TABLET BY MOUTH EVERY DAY 90 tablet 1  . mirabegron  (MYRBETRIQ ) 50 mg ER tablet Take 2 tablets (100 mg total) by mouth once daily 180 tablet 1  . multivitamin tablet Take 1 tablet by mouth once daily    . multivitamin with minerals, EYE, (PRESERVISION AREDS 2) soft gel capsule Take 1 capsule by mouth 2 (two) times daily with meals    . pravastatin  (PRAVACHOL ) 20 MG tablet TAKE  1 TABLET BY MOUTH EVERY DAY 90 tablet 1  . sulfamethoxazole-trimethoprim (BACTRIM DS) 800-160 mg tablet Take 1 tablet (160 mg of trimethoprim total) by mouth 2 (two) times daily for 5 days 10 tablet 0  . timoloL  maleate (TIMOPTIC ) 0.25 % ophthalmic solution Place 1 drop into both eyes 2 (two) times daily    . brinzolamide (AZOPT) 1 % ophthalmic suspension Place 1 drop into both eyes 2 (two) times daily     No current facility-administered  medications for this visit.    Allergies as of 11/02/2023 - Reviewed 11/02/2023  Allergen Reaction Noted  . Augmentin [amoxicillin-pot clavulanate] Other (See Comments) 08/28/2013  . Oxycodone  Other (See Comments) 12/09/2016    Patient Active Problem List  Diagnosis  . Dermatophytosis of nail  . Pernicious anemia  . Pure hypercholesterolemia  . Other hammer toe (acquired)  . B12 deficiency  . Benign neoplasm of colon  . Hallux valgus (acquired)  . Essential hypertension, benign  . Osteoporosis  . Vitamin B 12 deficiency  . Diverticulosis  . Peripheral neuropathy  . Chronic fatigue  . Essential hypertension  . Hyperlipidemia, unspecified hyperlipidemia  . Anemia, unspecified  . Anxiety  . Mixed hyperlipidemia  . Other allergic rhinitis  . Dizziness  . Non-rheumatic mitral regurgitation  . Urinary incontinence in female  . Closed Barton's fracture of left radius  . CKD (chronic kidney disease) stage 3, GFR 30-59 ml/min (CMS/HHS-HCC)  . Chronic left hip pain  . Urge incontinence of urine  . Status post total replacement of left hip  . History of total left hip replacement  . Left lumbar radiculopathy  . Osteopenia of the elderly  . Non-seasonal allergic rhinitis  . Intermittent vertigo  . Imbalance  . OSA (obstructive sleep apnea)  . Fall at home, initial encounter  . Bilateral leg edema    Past Medical History:  Diagnosis Date  . Allergic state   . Anxiety   . Arthritis   . Benign neoplasm of colon 08/28/2013  . Cataract cortical, senile    had surgery for both eyes  . Depression 08/28/2013  . Dermatophytosis of nail 08/28/2013  . Diverticulosis   . Essential hypertension, benign 1995  . GERD (gastroesophageal reflux disease)   . Glaucoma (increased eye pressure)   . Hallux valgus (acquired) 08/28/2013  . Heart murmur   . Kidney stone 1983  . OSA (obstructive sleep apnea) 07/31/2019  . Osteoporosis 08/28/2013   Fosamax  . Other and unspecified  hyperlipidemia 08/28/2013  . Other hammer toe (acquired) 08/28/2013  . Peripheral neuropathy   . Pernicious anemia 08/28/2013  . Pure hypercholesterolemia 08/28/2013  . Unspecified vitamin D deficiency 08/28/2013    Past Surgical History:  Procedure Laterality Date  . HYSTERECTOMY VAGINAL  1967   fibroid tumor  . OOPHORECTOMY Bilateral 1992  . CHOLECYSTECTOMY  09/1997  . COLONOSCOPY  09/19/2006  . COLONOSCOPY  11/04/2009   PH adenomatous polyps  . COLONOSCOPY  08/13/2013  . Left total hip arthroplasty   10/27/2016   Dr Mardee  . APPENDECTOMY    . HERNIA REPAIR     x 3  . TONSILLECTOMY      Health Maintenance  Topic Date Due  . Parathyroid Hormone  Never done  . Shingrix (1 of 2) Never done  . RSV Immunization Pregnant or 60+ (1 - 1-dose 75+ series) Never done  . Adult Tetanus (Td And Tdap)  02/07/2018  . Annual Urine Albumin Creatinine Ratio  08/20/2018  . DXA  Bone Density Scan  03/21/2022  . Depression Screening  09/12/2022  . COVID-19 Vaccine (3 - 2024-25 season) 02/13/2023  . Colonoscopy  08/14/2023  . Serum Phosphorus  11/09/2023  . Creatinine Level  10/25/2024  . Potassium Level  10/25/2024  . Lipid Panel  10/25/2024  . Serum Bicarbonate  10/25/2024  . Serum Calcium   10/25/2024  . Medicare Subsequent AWV [id number]  11/02/2024  . Diabetes Screening  10/26/2026  . Pneumococcal Vaccine: 50+  Completed  . Influenza Vaccine  Completed  . Hib Vaccines  Aged Out  . Hepatitis A Vaccines  Aged Out  . Meningococcal B Vaccine  Aged Out  . Meningococcal ACWY Vaccine  Aged Out  . HPV Vaccines  Aged Out    Vitals:   11/02/23 1405  BP: 118/74  Pulse: 75  SpO2: 94%  Weight: 70.8 kg (156 lb)  Height: 160 cm (5' 3)   Body mass index is 27.63 kg/m. General: Alert oriented x3  Eyes: Sclera and conjunctiva clear; pupils equal round and reactive to light and accommodation; extraocular movements intact Nose: Mucosa healthy without drainage or ulceration Oropharynx: No  suspicious lesions Neck: No swelling, masses, stiffness, pain, limited movement, carotid pulses normal bilaterally, thyroid normal size, no masses palpated. No bruits heard. Lungs: Respirations unlabored; clear to auscultation bilaterally Back: No spinal deformity Cardiovascular: Heart regular rate and rhythm without murmurs, gallops, or rubs Abdomen: Soft; non tender; non distended;  no masses or organomegaly Lymph Nodes: No significant cervical, supraclavicular, or axillary lymphadenopathy noted Musculoskeletal: No active joint inflammation Extremities: Normal, no edema Pulses: Dorsalis pedis palpable and symmetric bilaterally Neurologic: Alert and oriented; speech intact; face symmetrical; moves all extremities well    Assessment/Plan  1. Medicare wellness visit- Medications and allergies reviewed. Copy of preventative health provided.  Labs reviewed. 2. HTN- stable, same meds 3, CKD- push fluids 4. HLD- diet/exercise/statin 5. B12 def- increase po supplement, B12 shot today 6. RTC 6 mo, sooner if needed Reyes JONETTA Costa, MD, MD

## 2023-12-23 ENCOUNTER — Encounter: Payer: Self-pay | Admitting: Student

## 2023-12-23 ENCOUNTER — Non-Acute Institutional Stay (SKILLED_NURSING_FACILITY): Payer: Self-pay | Admitting: Student

## 2023-12-23 DIAGNOSIS — N1831 Chronic kidney disease, stage 3a: Secondary | ICD-10-CM | POA: Diagnosis not present

## 2023-12-23 DIAGNOSIS — F0150 Vascular dementia without behavioral disturbance: Secondary | ICD-10-CM

## 2023-12-23 DIAGNOSIS — Z96649 Presence of unspecified artificial hip joint: Secondary | ICD-10-CM

## 2023-12-23 DIAGNOSIS — E7800 Pure hypercholesterolemia, unspecified: Secondary | ICD-10-CM

## 2023-12-23 DIAGNOSIS — G309 Alzheimer's disease, unspecified: Secondary | ICD-10-CM | POA: Diagnosis not present

## 2023-12-23 DIAGNOSIS — F418 Other specified anxiety disorders: Secondary | ICD-10-CM

## 2023-12-23 DIAGNOSIS — F0280 Dementia in other diseases classified elsewhere without behavioral disturbance: Secondary | ICD-10-CM

## 2023-12-23 DIAGNOSIS — I10 Essential (primary) hypertension: Secondary | ICD-10-CM

## 2023-12-23 NOTE — Progress Notes (Signed)
 Provider:  Dr. Richerd Brigham Location:  Other [facility].  Nursing Home Room Number: [facility] Place of Service:  SNF (31)  PCP: Brigham Richerd, MD [name], [name]  Extended Emergency Contact Information Primary Emergency Contact: Taaffe,David  [place] Home Phone: [phone] Work Phone: [phone] Relation: Son Secondary Emergency Contact: Burnetta (POA),Geraldine Address: [address]          [place], [place] [address] [place] Home Phone: [phone] Mobile Phone: [phone] Relation: Daughter  Code Status: DNR Goals of Care: Advanced Directive information    12/23/2023   12:10 PM  Advanced Directives  Does Patient Have a Medical Advance Directive? Yes  Type of Advance Directive Out of facility DNR (pink MOST or yellow form)  Does patient want to make changes to medical advance directive? No - Patient declined      Chief Complaint  Patient presents with   New Admit To SNF    Admission.     HPI: Patient is a 89 y.o. female seen today for admission to [facility] History of Present Illness The patient is a 89 year old with Alzheimer's and dementia who presents for a geriatric consultation and medication review. She is accompanied by her daughter and son.  She has been experiencing difficulty sleeping since moving to a new place, with dizziness occurring when transitioning from lying to sitting or standing. She requires assistance to wake up in the morning. Previously, she had no sleep issues and typically slept 12-13 hours a night.  Her hearing has been worsening over the past four years despite using hearing aids, which are reportedly not functioning well. The hearing aids were obtained from [facility], and adjustments have been made, but her hearing continues to decline.  She experiences significant incontinence issues, described as 'off  the chart,' and uses Depends and Tena pads to manage this. She has been diagnosed with Alzheimer's and dementia.  She has high blood pressure, with a recent reading of 150/69, although it was 129/unknown the previous day. She is on lisinopril  20 mg for blood pressure management.  She experiences memory changes, with no significant fluctuations throughout the day. She has had hallucinations in the past, particularly when on antibiotics, but these resolved after discontinuation of the medication.  She has a history of hip replacement surgery and reports occasional hip pain. She also has a Baker's cyst behind her right knee, causing weakness. She experiences headaches in the right temple area, particularly in the morning.  She has stage 3 kidney disease, which has been stable for several years, and is monitored with labs every six months.  She takes several medications, including Celexa  40 mg for mood, and Myrbetriq  for overactive bladder, which she reports as not significantly effective. She also takes aspirin twice a week, a multivitamin, diclofenac as needed for arthritis, and eye drops for glaucoma.  She has a history of macular degeneration and takes Areds 2 supplements for this condition. She also takes B12 gummies due to low B12 levels.  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) - Partner Status: Widowed - Living Situation: Resides in an assisted living community   Family History - Spouse: deceased  Past Medical History:  Diagnosis Date   Anxiety    Arthritis    B12 deficiency    Baker's cyst    Rt leg   Elevated lipids    Environmental and seasonal allergies  GERD (gastroesophageal reflux disease)    High cholesterol    Hypertension    Neuropathy    Past Surgical History:  Procedure Laterality Date   ABDOMINAL HYSTERECTOMY     CATARACT EXTRACTION W/ INTRAOCULAR LENS IMPLANT Bilateral    CHOLECYSTECTOMY     HERNIA REPAIR     TOTAL HIP ARTHROPLASTY Left  10/27/2016   Procedure: TOTAL HIP ARTHROPLASTY;  Surgeon: Mardee Lynwood SQUIBB, MD;  Location: [facility] ORS;  Service: Orthopedics;  Laterality: Left;    reports that she has never smoked. She has never used smokeless tobacco. She reports that she does not drink alcohol  and does not use drugs. Social History   Socioeconomic History   Marital status: Married    Spouse name: Not on file   Number of children: Not on file   Years of education: Not on file   Highest education level: Not on file  Occupational History   Not on file  Tobacco Use   Smoking status: Never   Smokeless tobacco: Never  Substance and Sexual Activity   Alcohol  use: No   Drug use: No   Sexual activity: Not on file  Other Topics Concern   Not on file  Social History Narrative   Not on file   Social Drivers of Health   Financial Resource Strain: Low Risk  (02/03/2023)   Received from [facility]   Overall Financial Resource Strain (CARDIA)    Difficulty of Paying Living Expenses: Not hard at all  Food Insecurity: No Food Insecurity (02/03/2023)   Received from [facility]   Hunger Vital Sign    Within the past 12 months, you worried that your food would run out before you got the money to buy more.: Never true    Within the past 12 months, the food you bought just didn't last and you didn't have money to get more.: Never true  Transportation Needs: No Transportation Needs (02/03/2023)   Received from [facility] - Transportation    In the past 12 months, has lack of transportation kept you from medical appointments or from getting medications?: No    Lack of Transportation (Non-Medical): No  Physical Activity: Not on file  Stress: Not on file  Social Connections: Not on file  Intimate Partner Violence: Not on file    Functional Status Survey:    Family History  Problem Relation Age of Onset   Alzheimer's disease Mother    Heart attack Mother     Stroke Father    Alzheimer's disease Father     Health Maintenance  Topic Date Due   Medicare Annual Wellness (AWV)  Never done   DTaP/Tdap/Td (1 - Tdap) Never done   Zoster Vaccines- Shingrix (1 of 2) Never done   COVID-19 Vaccine (3 - Pfizer risk series) 08/21/2019   INFLUENZA VACCINE  01/13/2024   Pneumococcal Vaccine: 50+ Years  Completed   DEXA SCAN  Completed   Hepatitis B Vaccines  Aged Out   HPV VACCINES  Aged Out   Meningococcal B Vaccine  Aged Out    Allergies  Allergen Reactions   Augmentin [Amoxicillin-Pot Clavulanate] Nausea Only, Rash and Other (See Comments)    GI upset GI upset Has patient had a PCN reaction causing immediate rash, facial/tongue/throat swelling, SOB or lightheadedness with hypotension:No Has patient had a PCN reaction causing severe rash involving mucus membranes or skin necrosis:No Has patient had a PCN reaction that required hospitalization:No Has  patient had a PCN reaction occurring within the last 10 years:Yes--upset stomach If all of the above answers are NO, then may proceed with Cephalosporin use.    Oxycodone  Other (See Comments)    Memory loss, medication is too strong.    Outpatient Encounter Medications as of 12/23/2023  Medication Sig   acetaminophen  (TYLENOL ) 500 MG tablet Take 1,000 mg by mouth every 8 (eight) hours as needed (for pain.). Gel cap   aspirin EC 81 MG tablet Take 81 mg by mouth daily. Swallow whole.   brinzolamide (AZOPT) 1 % ophthalmic suspension Place 1 drop into both eyes 2 (two) times daily.   citalopram  (CELEXA ) 40 MG tablet Take 40 mg by mouth daily.    Diclofenac Potassium 25 MG TABS Take 50 mg by mouth daily as needed.   lisinopril  (ZESTRIL ) 20 MG tablet Take 20 mg by mouth daily.   pravastatin  (PRAVACHOL ) 20 MG tablet Take 20 mg by mouth at bedtime.   timolol  (BETIMOL ) 0.25 % ophthalmic solution Place 1-2 drops into both eyes 2 (two) times daily.   [DISCONTINUED] ALPRAZolam  (XANAX ) 0.5 MG tablet Take 0.5  mg by mouth daily as needed.   [DISCONTINUED] mirabegron  ER (MYRBETRIQ ) 25 MG TB24 tablet Take 1 tablet (25 mg total) daily by mouth. (Patient taking differently: Take 50 mg by mouth daily.)   [DISCONTINUED] ARIPiprazole  (ABILIFY ) 5 MG tablet Take 5 mg by mouth daily. (Patient not taking: Reported on 12/23/2023)   [DISCONTINUED] bimatoprost  (LUMIGAN ) 0.01 % SOLN Place 1 drop into both eyes at bedtime. (Patient not taking: Reported on 12/23/2023)   [DISCONTINUED] calcium  carbonate (OSCAL) 1500 (600 Ca) MG TABS tablet Take 600 mg by mouth daily with breakfast.  (Patient not taking: Reported on 12/23/2023)   [DISCONTINUED] cyanocobalamin  (,VITAMIN B-12,) 1000 MCG/ML injection Inject 1,000 mcg into the muscle every 30 (thirty) days. (Patient not taking: Reported on 12/23/2023)   [DISCONTINUED] docusate sodium  (COLACE) 50 MG capsule Take 100 mg by mouth daily. (Patient not taking: Reported on 12/23/2023)   [DISCONTINUED] ferrous sulfate  325 (65 FE) MG tablet Take 1 tablet by mouth daily. (Patient not taking: Reported on 12/23/2023)   [DISCONTINUED] ipratropium (ATROVENT ) 0.06 % nasal spray Place 2 sprays into the nose as needed. (Patient not taking: Reported on 12/23/2023)   [DISCONTINUED] meclizine  (ANTIVERT ) 25 MG tablet Take 1 tablet (25 mg total) by mouth 3 (three) times daily as needed for dizziness. (Patient not taking: Reported on 12/23/2023)   [DISCONTINUED] Multiple Vitamin (MULTI-VITAMIN) tablet Take 1 tablet by mouth daily. (Patient not taking: Reported on 12/23/2023)   [DISCONTINUED] quinapril -hydrochlorothiazide  (ACCURETIC ) 20-12.5 MG per tablet Take 1 tablet by mouth daily.  (Patient not taking: Reported on 12/23/2023)   [DISCONTINUED] solifenacin (VESICARE) 10 MG tablet Take 10 mg by mouth daily. (Patient not taking: Reported on 12/23/2023)   [DISCONTINUED] traMADol  (ULTRAM ) 50 MG tablet Take 1-2 tablets (50-100 mg total) by mouth every 4 (four) hours as needed for moderate pain. (Patient not taking:  Reported on 12/23/2023)   No facility-administered encounter medications on file as of 12/23/2023.    Review of Systems  Vitals:   12/23/23 1132  BP: 129/75  Pulse: 66  Resp: 20  Temp: 97.7 F (36.5 C)  SpO2: 98%   There is no height or weight on file to calculate BMI. Physical Exam  General: Generally well- appearing Neuro: Alert and oriented x4 CV: RRR, no murmurs Pulmonary: LTAB EXTREMITIES: Mild swelling in feet and legs. Labs reviewed: Basic Metabolic Panel: No results for input(s): NA,  K, CL, CO2, GLUCOSE, BUN, CREATININE, CALCIUM , MG, PHOS in the last 8760 hours. Liver Function Tests: No results for input(s): AST, ALT, ALKPHOS, BILITOT, PROT, ALBUMIN in the last 8760 hours. No results for input(s): LIPASE, AMYLASE in the last 8760 hours. No results for input(s): AMMONIA in the last 8760 hours. CBC: No results for input(s): WBC, NEUTROABS, HGB, HCT, MCV, PLT in the last 8760 hours. Cardiac Enzymes: No results for input(s): CKTOTAL, CKMB, CKMBINDEX, TROPONINI in the last 8760 hours. BNP: Invalid input(s): POCBNP No results found for: HGBA1C No results found for: TSH Lab Results  Component Value Date   VITAMINB12 416 03/24/2020   No results found for: FOLATE No results found for: IRON, TIBC, FERRITIN  Imaging and Procedures obtained prior to SNF admission: CT HEAD WO CONTRAST ( ) Result Date: 01/05/2023 CLINICAL DATA:  Memory loss EXAM: CT HEAD WITHOUT CONTRAST TECHNIQUE: Contiguous axial images were obtained from the base of the skull through the vertex without intravenous contrast. RADIATION DOSE REDUCTION: This exam was performed according to the departmental dose-optimization program which includes automated exposure control, adjustment of the mA and/or kV according to patient size and/or use of iterative reconstruction technique. COMPARISON:  Head CT 03/24/2020 FINDINGS: Brain: No evidence  of acute infarction, hemorrhage, hydrocephalus, extra-axial collection or mass lesion/mass effect. Parenchymal calcification in the right frontal lobe appears unchanged. There is minimal mild periventricular white matter hypodensity, likely chronic small vessel ischemic change. Small old lacunar infarct in the left caudate head appears unchanged. Vascular: Atherosclerotic calcifications are present within the cavernous internal carotid arteries. Skull: Normal. Negative for fracture or focal lesion. Sinuses/Orbits: No acute finding. Other: None. IMPRESSION: 1. No acute intracranial process. 2. Mild chronic small vessel ischemic change. Electronically Signed   By: Greig Pique M.D.   On: 01/05/2023 20:48    Assessment/Plan Alzheimer's disease Diagnosed with Alzheimer's disease and dementia with gradual worsening. No recent falls or significant changes in memory throughout the day. Occasional hallucinations possibly related to antibiotic use. Dementia associated with Alzheimer's disease. No significant changes in memory throughout the day. Occasional hallucinations possibly related to antibiotic use. - Continue current care plan with neurologist Dr. Loreli. - Engage in social activities to stimulate cognitive function.  Depression Currently on citalopram  40 mg, which is above the recommended dose for patients over 65 due to risks of cardiotoxicity, falls, and memory changes. - Reduce citalopram  dose from 40 mg to 30 mg for one week, then to 20 mg. - Monitor mood and depression symptoms.  Incontinence Severe incontinence reported. Currently using Depends and Tena pads. Previously on Myrbetriq  100 mg with no significant improvement over 50 mg. - Transition from Myrbetriq  to Gemtesa for overactive bladder management.  Hypertension Blood pressure recorded at 150/69 today, slightly elevated compared to previous reading of 129 systolic. Possible influence of recent move and lack of sleep. - Monitor blood  pressure over the next two weeks. - Adjust lisinopril  dosage if blood pressure remains elevated.  Chronic kidney disease, stage 3 Stage 3 chronic kidney disease requiring careful medication management to avoid nephrotoxic drugs. - Avoid NSAIDs like diclofenac when possible no more than 2 weeks PRN, due to risk of kidney damage. - Schedule regular kidney function tests every six months.  Hearing loss Gradual worsening of hearing despite use of hearing aids for four years. Hearing aids not functioning optimally. - Ensure she is on the list for audiology evaluation by [facility].  Glaucoma Glaucoma managed with brinzolamide eye drops. - Continue  brinzolamide eye drops as prescribed.  Macular degeneration Macular degeneration managed with Areds 2 supplements. - Continue Areds 2 supplements twice daily.  Hip replacement Occasional discomfort reported post-hip replacement. - Provide Tylenol  as needed for hip pain.  Family/ staff Communication: Daughter, Son, Nursing  Labs/tests ordered: CBC, BMP   I spent greater than 50  minutes for the care of this patient in face to face time, chart review, clinical documentation, patient education. I spent an additional 16 minutes discussing goals of care and advanced care planning.

## 2023-12-29 LAB — BASIC METABOLIC PANEL WITH GFR
BUN: 24 — AB (ref 4–21)
CO2: 25 — AB (ref 13–22)
Chloride: 108 (ref 99–108)
Creatinine: 0.9 (ref 0.5–1.1)
Glucose: 86
Potassium: 4.4 meq/L (ref 3.5–5.1)
Sodium: 141 (ref 137–147)

## 2023-12-29 LAB — CBC AND DIFFERENTIAL
HCT: 36 (ref 36–46)
Hemoglobin: 11.4 — AB (ref 12.0–16.0)
Neutrophils Absolute: 5949
Platelets: 241 K/uL (ref 150–400)
WBC: 9.8

## 2023-12-29 LAB — COMPREHENSIVE METABOLIC PANEL WITH GFR
Albumin: 3.9 (ref 3.5–5.0)
Calcium: 10.2 (ref 8.7–10.7)
Globulin: 2.3
eGFR: 61

## 2023-12-29 LAB — HEPATIC FUNCTION PANEL
ALT: 9 U/L (ref 7–35)
AST: 15 (ref 13–35)
Alkaline Phosphatase: 59 (ref 25–125)
Bilirubin, Total: 0.7

## 2023-12-29 LAB — CBC: RBC: 3.76 — AB (ref 3.87–5.11)

## 2024-01-04 ENCOUNTER — Non-Acute Institutional Stay (SKILLED_NURSING_FACILITY): Payer: Self-pay | Admitting: Student

## 2024-01-04 DIAGNOSIS — K5904 Chronic idiopathic constipation: Secondary | ICD-10-CM | POA: Diagnosis not present

## 2024-01-04 DIAGNOSIS — J320 Chronic maxillary sinusitis: Secondary | ICD-10-CM | POA: Diagnosis not present

## 2024-01-04 DIAGNOSIS — R32 Unspecified urinary incontinence: Secondary | ICD-10-CM

## 2024-01-05 ENCOUNTER — Encounter: Payer: Self-pay | Admitting: Student

## 2024-01-05 LAB — CBC AND DIFFERENTIAL
HCT: 35 — AB (ref 36–46)
Hemoglobin: 10.8 — AB (ref 12.0–16.0)
Platelets: 266 K/uL (ref 150–400)
WBC: 7.8

## 2024-01-05 LAB — CBC: RBC: 3.62 — AB (ref 3.87–5.11)

## 2024-01-05 NOTE — Progress Notes (Signed)
 Location:      Place of Service:    Provider:  Abdul Abdul Fine, MD  Patient Care Team: Abdul Fine, MD as PCP - General [facility])  Extended Emergency Contact Information Primary Emergency Contact: Aviyana, [place] Home Phone: [phone] Work Phone: [phone] Relation: Son Secondary Emergency Contact: Burnetta (POA),Geraldine Address: [address]          [place], [place] [address] [place] Home Phone: [phone] Mobile Phone: [phone] Relation: Daughter  Code Status:  DNR Goals of care: Advanced Directive information    12/23/2023   12:10 PM  Advanced Directives  Does Patient Have a Medical Advance Directive? Yes  Type of Advance Directive Out of facility DNR (pink MOST or yellow form)  Does patient want to make changes to medical advance directive? No - Patient declined     No chief complaint on file.   HPI:  Pt is a 89 y.o. female seen today for an acute visit Discussed the use of AI scribe software for clinical note transcription with the patient, who gave verbal consent to proceed.  History of Present Illness   History of Present Illness The patient is a 89 year old who presents with increased urinary frequency and incontinence. She is accompanied by her caregiver.  Over the past week, she has experienced increased urinary frequency and incontinence, with a notable increase in nighttime urination, waking up four times a night compared to her usual two to three times. An episode of bedwetting occurred, which is unusual for her. She uses the bathroom more frequently during the day, including during activities such as arts and crafts. No burning during urination is reported.  She has been on Gemtesa since her arrival at the facility, which coincided with an increase in bathroom visits. Previously, she was on Myrbetriq , which she felt was effective, and she has recently been restarted on it. She has  a history of urinary tract infections, sometimes asymptomatic, and has been treated with antibiotics in the past.  She has experienced constipation recently but reports that her bowel movements have normalized over the past two days. She drinks more water now than she did at home, which may contribute to increased urination.  She reports no appetite and has been snacking less frequently. She experiences lower back pain at times.  =    Past Medical History:  Diagnosis Date   Anxiety    Arthritis    B12 deficiency    Baker's cyst    Rt leg   Elevated lipids    Environmental and seasonal allergies    GERD (gastroesophageal reflux disease)    High cholesterol    Hypertension    Neuropathy    Past Surgical History:  Procedure Laterality Date   ABDOMINAL HYSTERECTOMY     CATARACT EXTRACTION W/ INTRAOCULAR LENS IMPLANT Bilateral    CHOLECYSTECTOMY     HERNIA REPAIR     TOTAL HIP ARTHROPLASTY Left 10/27/2016   Procedure: TOTAL HIP ARTHROPLASTY;  Surgeon: Mardee Lynwood SQUIBB, MD;  Location: [facility] ORS;  Service: Orthopedics;  Laterality: Left;    Allergies  Allergen Reactions   Augmentin [Amoxicillin-Pot Clavulanate] Nausea Only, Rash and Other (See Comments)    GI upset GI upset Has patient had a PCN reaction causing immediate rash, facial/tongue/throat swelling, SOB or lightheadedness with hypotension:No Has patient had a PCN reaction causing severe rash involving mucus membranes or skin necrosis:No Has patient had a PCN reaction that required hospitalization:No Has patient had a  PCN reaction occurring within the last 10 years:Yes--upset stomach If all of the above answers are NO, then may proceed with Cephalosporin use.    Oxycodone  Other (See Comments)    Memory loss, medication is too strong.    Outpatient Encounter Medications as of 01/04/2024  Medication Sig   acetaminophen  (TYLENOL ) 500 MG tablet Take 1,000 mg by mouth every 8 (eight) hours as needed (for pain.). Gel cap    aspirin EC 81 MG tablet Take 81 mg by mouth daily. Swallow whole.   brinzolamide (AZOPT) 1 % ophthalmic suspension Place 1 drop into both eyes 2 (two) times daily.   citalopram  (CELEXA ) 40 MG tablet Take 40 mg by mouth daily.    Diclofenac Potassium 25 MG TABS Take 50 mg by mouth daily as needed.   lisinopril  (ZESTRIL ) 20 MG tablet Take 20 mg by mouth daily.   pravastatin  (PRAVACHOL ) 20 MG tablet Take 20 mg by mouth at bedtime.   timolol  (BETIMOL ) 0.25 % ophthalmic solution Place 1-2 drops into both eyes 2 (two) times daily.   No facility-administered encounter medications on file as of 01/04/2024.    Review of Systems  Immunization History  Administered Date(s) Administered   Influenza Inj Mdck Quad Pf 03/29/2022   PFIZER Comirnaty(Gray Top)Covid-19 Tri-Sucrose Vaccine 06/23/2019, 07/24/2019   Pneumococcal Conjugate-13 03/26/2014   Pneumococcal Polysaccharide-23 03/02/2013   Pertinent  Health Maintenance Due  Topic Date Due   INFLUENZA VACCINE  01/13/2024   DEXA SCAN  Completed      03/23/2020    9:27 PM 03/24/2020    6:46 PM 03/25/2020    3:41 AM 03/25/2020    1:11 PM  Fall Risk  (RETIRED) Patient Fall Risk Level High fall risk  High fall risk  High fall risk  High fall risk      Data saved with a previous flowsheet row definition   Functional Status Survey:    There were no vitals filed for this visit. There is no height or weight on file to calculate BMI. Physical Exam Constitutional:      Appearance: Normal appearance.  Cardiovascular:     Rate and Rhythm: Normal rate and regular rhythm.     Pulses: Normal pulses.     Heart sounds: Normal heart sounds.  Pulmonary:     Effort: Pulmonary effort is normal.  Abdominal:     General: Abdomen is flat. Bowel sounds are normal.     Palpations: Abdomen is soft.     Comments: R CVA tenderness  Musculoskeletal:        General: No swelling or tenderness.  Skin:    General: Skin is warm and dry.  Neurological:      Mental Status: She is alert.     Gait: Gait normal.     Deep Tendon Reflexes: Reflexes normal.  Psychiatric:        Mood and Affect: Mood normal.     Labs reviewed: No results for input(s): NA, K, CL, CO2, GLUCOSE, BUN, CREATININE, CALCIUM , MG, PHOS in the last 8760 hours. No results for input(s): AST, ALT, ALKPHOS, BILITOT, PROT, ALBUMIN in the last 8760 hours. No results for input(s): WBC, NEUTROABS, HGB, HCT, MCV, PLT in the last 8760 hours. No results found for: TSH No results found for: HGBA1C No results found for: CHOL, HDL, LDLCALC, LDLDIRECT, TRIG, CHOLHDL  Significant Diagnostic Results in last 30 days:  No results found.  Assessment/Plan Urinary incontinence Increased urinary frequency and urgency with episodes of incontinence, worsened over the  last week. Possible urinary retention risk with Myrbetriq . Differential includes urinary tract infection or medication side effect. - Continue Myrbetriq  and monitor symptoms - Perform urine test to evaluate for infection, treat based on culture results.  - Discontinue Myrbetriq  if recurrent urinary tract infections occur  Constipation Recent constipation resolved with bowel movements reported yesterday and today.  Sinus headache Headache present all day, suspected to be sinus-related. Previously used Flonase  with relief. - Restart Flonase , two sprays once a day Family/ staff Communication: nursing  Labs/tests ordered:  ua/cs

## 2024-01-30 ENCOUNTER — Encounter: Payer: Self-pay | Admitting: Student

## 2024-01-30 ENCOUNTER — Non-Acute Institutional Stay (SKILLED_NURSING_FACILITY): Payer: Self-pay | Admitting: Student

## 2024-01-30 DIAGNOSIS — G44229 Chronic tension-type headache, not intractable: Secondary | ICD-10-CM | POA: Diagnosis not present

## 2024-01-30 DIAGNOSIS — N1831 Chronic kidney disease, stage 3a: Secondary | ICD-10-CM | POA: Diagnosis not present

## 2024-01-30 DIAGNOSIS — I10 Essential (primary) hypertension: Secondary | ICD-10-CM

## 2024-01-30 DIAGNOSIS — G309 Alzheimer's disease, unspecified: Secondary | ICD-10-CM

## 2024-01-30 DIAGNOSIS — F0150 Vascular dementia without behavioral disturbance: Secondary | ICD-10-CM

## 2024-01-30 DIAGNOSIS — F418 Other specified anxiety disorders: Secondary | ICD-10-CM | POA: Diagnosis not present

## 2024-01-30 DIAGNOSIS — F0280 Dementia in other diseases classified elsewhere without behavioral disturbance: Secondary | ICD-10-CM

## 2024-01-30 NOTE — Progress Notes (Signed)
 Location:  [facility] Room Number: [facility] 305-A Place of Service:  SNF [phone]) Provider:  Abdul Fine, MD  Abdul Fine, MD  Patient Care Team: Abdul Fine, MD as PCP - General [facility])  Extended Emergency Contact Information Primary Emergency Contact: Reta,David  [place] Home Phone: [phone] Work Phone: [phone] Relation: Son Secondary Emergency Contact: Burnetta (POA),Geraldine Address: [address]          [place], [place] [address] [place] Home Phone: [phone] Mobile Phone: [phone] Relation: Daughter  Code Status:  DNR Goals of care: Advanced Directive information    01/30/2024    3:08 PM  Advanced Directives  Does Patient Have a Medical Advance Directive? Yes  Type of Advance Directive Out of facility DNR (pink MOST or yellow form)  Does patient want to make changes to medical advance directive? No - Patient declined     Chief Complaint  Patient presents with   Medical Management of Chronic Issues    Routine visit     HPI:  Pt is a 89 y.o. female seen today for medical management of chronic diseases.   History of Present Illness The patient is a 89 year old with dementia who presents for evaluation of urinary incontinence and mood changes.  She recently moved to a new residence about a month ago due to concerns about her safety with independent living. She finds the environment pleasant and participates in activities and meals more frequently, enjoying the sunshine outdoors.  She is independent with transfers and bathroom use but has episodes of incontinence. Her urine was checked three weeks ago and was negative for infection. She urinates four times at night, which is typical for her.  She occasionally takes Tylenol  for longstanding temple pain, which resolves with medication. She is on lisinopril  for blood pressure management. She experiences occasional  dizziness, which resolves within a few minutes.  She is on Celexa  for mood management, currently at a decreased dose of 20 mg. She describes herself as 'kind of moody' and was tired and anxious to get to bed last night after being too active during the day. She tries to maintain a calm demeanor and avoid being 'ugly'.  She reports having good bowel movements and eating well, including a good breakfast this morning.   Social History - Living Situation: Resides in a care facility due to dementia and safety concerns with independent living. - Patient is 89 years old, has a supportive family who visits, and participates in activities at the facility. She has a history of dementia and urinary incontinence.   Past Medical History:  Diagnosis Date   Anxiety    Arthritis    B12 deficiency    Baker's cyst    Rt leg   Elevated lipids    Environmental and seasonal allergies    GERD (gastroesophageal reflux disease)    High cholesterol    Hypertension    Neuropathy    Past Surgical History:  Procedure Laterality Date   ABDOMINAL HYSTERECTOMY     CATARACT EXTRACTION W/ INTRAOCULAR LENS IMPLANT Bilateral    CHOLECYSTECTOMY     HERNIA REPAIR     TOTAL HIP ARTHROPLASTY Left 10/27/2016   Procedure: TOTAL HIP ARTHROPLASTY;  Surgeon: Mardee Lynwood SQUIBB, MD;  Location: [facility] ORS;  Service: Orthopedics;  Laterality: Left;    Allergies  Allergen Reactions   Augmentin [Amoxicillin-Pot Clavulanate] Nausea Only, Rash and Other (See Comments)    GI upset  GI upset Has patient had a PCN reaction causing immediate rash, facial/tongue/throat swelling, SOB or lightheadedness with hypotension:No Has patient had a PCN reaction causing severe rash involving mucus membranes or skin necrosis:No Has patient had a PCN reaction that required hospitalization:No Has patient had a PCN reaction occurring within the last 10 years:Yes--upset stomach If all of the above answers are NO, then may proceed with  Cephalosporin use.    Oxycodone  Other (See Comments)    Memory loss, medication is too strong.    Allergies as of 01/30/2024       Reactions   Augmentin [amoxicillin-pot Clavulanate] Nausea Only, Rash, Other (See Comments)   GI upset GI upset Has patient had a PCN reaction causing immediate rash, facial/tongue/throat swelling, SOB or lightheadedness with hypotension:No Has patient had a PCN reaction causing severe rash involving mucus membranes or skin necrosis:No Has patient had a PCN reaction that required hospitalization:No Has patient had a PCN reaction occurring within the last 10 years:Yes--upset stomach If all of the above answers are NO, then may proceed with Cephalosporin use.   Oxycodone  Other (See Comments)   Memory loss, medication is too strong.        Medication List        Accurate as of January 30, 2024  3:08 PM. If you have any questions, ask your nurse or doctor.          acetaminophen  500 MG tablet Commonly known as: TYLENOL  Take 1,000 mg by mouth every 8 (eight) hours as needed (for pain.). Gel cap   aspirin EC 81 MG tablet Take 81 mg by mouth daily. Swallow whole.   brinzolamide 1 % ophthalmic suspension Commonly known as: AZOPT Place 1 drop into both eyes 2 (two) times daily.   citalopram  40 MG tablet Commonly known as: CELEXA  Take 40 mg by mouth daily.   cyanocobalamin  1000 MCG tablet Commonly known as: VITAMIN B12 Take 1,000 mcg by mouth daily.   Diclofenac Potassium 25 MG Tabs Take 50 mg by mouth daily as needed.   lisinopril  20 MG tablet Commonly known as: ZESTRIL  Take 20 mg by mouth daily.   mirabegron  ER 50 MG Tb24 tablet Commonly known as: MYRBETRIQ  Take 50 mg by mouth daily.   pravastatin  20 MG tablet Commonly known as: PRAVACHOL  Take 20 mg by mouth at bedtime.   PRESERVISION AREDS PO Take by mouth.   timolol  0.25 % ophthalmic solution Commonly known as: BETIMOL  Place 1-2 drops into both eyes 2 (two) times daily.         Review of Systems  Immunization History  Administered Date(s) Administered   Influenza Inj Mdck Quad Pf 03/29/2022   PFIZER Comirnaty(Gray Top)Covid-19 Tri-Sucrose Vaccine 06/23/2019, 07/24/2019   Pneumococcal Conjugate-13 03/26/2014   Pneumococcal Polysaccharide-23 03/02/2013   Pertinent  Health Maintenance Due  Topic Date Due   INFLUENZA VACCINE  01/13/2024   DEXA SCAN  Completed      03/23/2020    9:27 PM 03/24/2020    6:46 PM 03/25/2020    3:41 AM 03/25/2020    1:11 PM  Fall Risk  (RETIRED) Patient Fall Risk Level High fall risk  High fall risk  High fall risk  High fall risk      Data saved with a previous flowsheet row definition   Functional Status Survey:    Vitals:   01/30/24 1503  BP: (!) 171/76  Pulse: 83  Resp: 17  Temp: 97.9 F (36.6 C)  SpO2: 96%  Weight: 163 lb 6.4  oz (74.1 kg)  Height: 5' 3 (1.6 m)   Body mass index is 28.95 kg/m. Physical Exam Physical Exam CHEST: Lungs clear to auscultation bilaterally. CARDIOVASCULAR: Heart regular rate and rhythm, no murmurs. Labs reviewed: No results for input(s): NA, K, CL, CO2, GLUCOSE, BUN, CREATININE, CALCIUM , MG, PHOS in the last 8760 hours. No results for input(s): AST, ALT, ALKPHOS, BILITOT, PROT, ALBUMIN in the last 8760 hours. No results for input(s): WBC, NEUTROABS, HGB, HCT, MCV, PLT in the last 8760 hours. No results found for: TSH No results found for: HGBA1C No results found for: CHOL, HDL, LDLCALC, LDLDIRECT, TRIG, CHOLHDL  Significant Diagnostic Results in last 30 days:  No results found.  Assessment/Plan Hypertension Blood pressure is elevated. Currently on lisinopril . Concern about adding a diuretic due to frequent nocturnal urination. - Monitor blood pressure more frequently to determine if high readings are consistent or isolated incidents. Urinary incontinence Episodes of urinary incontinence. Recent urine test  negative for infection.  Depression Mood is generally well-managed on Celexa , reduced to 20 mg. Occasional moodiness, particularly when tired. Current dose is considered safer.  Chronic headache Occasional temple pain relieved by Tylenol . - Seek medical attention if headaches become severe or unresponsive to Tylenol .  Dementia Memory issues at home with safety concerns for independent living. Not suitable for assisted living due to urinary incontinence. Independent for transfers and bathroom use but experiences episodes of incontinence.  Family/ staff Communication: nursing  Labs/tests ordered:  none

## 2024-02-16 LAB — LIPID PANEL
Cholesterol: 122 (ref 0–200)
HDL: 48 (ref 35–70)
LDL Cholesterol: 58
Triglycerides: 80 (ref 40–160)

## 2024-02-16 LAB — VITAMIN D 25 HYDROXY (VIT D DEFICIENCY, FRACTURES): Vit D, 25-Hydroxy: 31

## 2024-02-16 LAB — VITAMIN B12: Vitamin B-12: 753

## 2024-02-22 ENCOUNTER — Encounter: Payer: Self-pay | Admitting: Student

## 2024-02-22 NOTE — Progress Notes (Signed)
 This encounter was created in error - please disregard.

## 2024-03-08 ENCOUNTER — Encounter: Payer: Self-pay | Admitting: Nurse Practitioner

## 2024-03-08 ENCOUNTER — Non-Acute Institutional Stay (SKILLED_NURSING_FACILITY): Payer: Self-pay | Admitting: Nurse Practitioner

## 2024-03-08 DIAGNOSIS — I10 Essential (primary) hypertension: Secondary | ICD-10-CM

## 2024-03-08 DIAGNOSIS — F418 Other specified anxiety disorders: Secondary | ICD-10-CM

## 2024-03-08 DIAGNOSIS — G44229 Chronic tension-type headache, not intractable: Secondary | ICD-10-CM

## 2024-03-08 DIAGNOSIS — N3281 Overactive bladder: Secondary | ICD-10-CM

## 2024-03-08 DIAGNOSIS — E538 Deficiency of other specified B group vitamins: Secondary | ICD-10-CM

## 2024-03-08 DIAGNOSIS — F0150 Vascular dementia without behavioral disturbance: Secondary | ICD-10-CM

## 2024-03-08 DIAGNOSIS — G309 Alzheimer's disease, unspecified: Secondary | ICD-10-CM

## 2024-03-08 DIAGNOSIS — F0280 Dementia in other diseases classified elsewhere without behavioral disturbance: Secondary | ICD-10-CM

## 2024-03-08 DIAGNOSIS — N1831 Chronic kidney disease, stage 3a: Secondary | ICD-10-CM

## 2024-03-08 DIAGNOSIS — R42 Dizziness and giddiness: Secondary | ICD-10-CM

## 2024-03-08 NOTE — Progress Notes (Signed)
 Location:  Other [facility].  Nursing Home Room Number: [facility] [id number] Place of Service:  SNF [phone]) Harlene An, NP  PCP: Abdul Fine, MD  Patient Care Team: Abdul Fine, MD as PCP - General [facility])  Extended Emergency Contact Information Primary Emergency Contact: Draughon,David  [place] Home Phone: [phone] Work Phone: [phone] Relation: Son Secondary Emergency Contact: Burnetta (POA),Geraldine Address: [address]          [place], [place] [address] [place] Home Phone: [phone] Mobile Phone: [phone] Relation: Daughter  Goals of care: Advanced Directive information    01/30/2024    3:08 PM  Advanced Directives  Does Patient Have a Medical Advance Directive? Yes  Type of Advance Directive Out of facility DNR (pink MOST or yellow form)  Does patient want to make changes to medical advance directive? No - Patient declined     Chief Complaint  Patient presents with   Medical Management of Chronic Issues    Medical Management of Chronic Issues.     HPI:  Pt is a 89 y.o. female seen today for medical management of chronic disease. Pt with hx of dementia, hearting loss, htn, OA.   Last month blood pressure medication was adjusted due to elevated blood pressure however remains high . She reports frequent headaches, reports she feels like it is sinuses.  No chest pains or palpitations.  No worsening of shortness of breath but reports in the morning when she gets up she will have some dyspnea.   She has had worsening LE edema. Worse in the last month.   Reports she has ongoing depression- still trying to adjust  Has vertigo and going for treatments at [facility]- she reports this is helping.  Wants to know if she can have vestibular rehab here at [facility].   Continues to have OAB- myrbetrq 50 mg daily- trailed gemtesa but it was worse.    Past Medical History:  Diagnosis Date   Anxiety     Arthritis    B12 deficiency    Baker's cyst    Rt leg   Elevated lipids    Environmental and seasonal allergies    GERD (gastroesophageal reflux disease)    High cholesterol    Hypertension    Neuropathy    Past Surgical History:  Procedure Laterality Date   ABDOMINAL HYSTERECTOMY     CATARACT EXTRACTION W/ INTRAOCULAR LENS IMPLANT Bilateral    CHOLECYSTECTOMY     HERNIA REPAIR     TOTAL HIP ARTHROPLASTY Left 10/27/2016   Procedure: TOTAL HIP ARTHROPLASTY;  Surgeon: Mardee Lynwood SQUIBB, MD;  Location: [facility] ORS;  Service: Orthopedics;  Laterality: Left;    Allergies  Allergen Reactions   Sulfamethoxazole-Trimethoprim Other (See Comments)    Hallucinations, upset stomach   Augmentin [Amoxicillin-Pot Clavulanate] Nausea Only, Rash and Other (See Comments)    GI upset GI upset Has patient had a PCN reaction causing immediate rash, facial/tongue/throat swelling, SOB or lightheadedness with hypotension:No Has patient had a PCN reaction causing severe rash involving mucus membranes or skin necrosis:No Has patient had a PCN reaction that required hospitalization:No Has patient had a PCN reaction occurring within the last 10 years:Yes--upset stomach If all of the above answers are NO, then may proceed with Cephalosporin use.    Oxycodone  Other (See Comments)    Memory loss, medication is too strong.    Outpatient Encounter Medications as of 03/08/2024  Medication Sig   acetaminophen  (TYLENOL ) 500 MG tablet Take  1,000 mg by mouth every 8 (eight) hours as needed (for pain.). Gel cap   amLODipine (NORVASC) 5 MG tablet Take 5 mg by mouth daily.   aspirin EC 81 MG tablet Take 81 mg by mouth daily. Every Tuesday and Thursday.   brinzolamide (AZOPT) 1 % ophthalmic suspension Place 1 drop into both eyes 2 (two) times daily.   citalopram  (CELEXA ) 10 MG tablet Take 10 mg by mouth daily.   cyanocobalamin  (VITAMIN B12) 1000 MCG tablet Take 1,000 mcg by mouth daily.   Diclofenac Potassium 25 MG  TABS Take 50 mg by mouth daily as needed.   lisinopril  (ZESTRIL ) 20 MG tablet Take 20 mg by mouth daily.   mirabegron  ER (MYRBETRIQ ) 50 MG TB24 tablet Take 50 mg by mouth daily.   Multiple Vitamins-Minerals (PRESERVISION AREDS PO) Take 1 tablet by mouth 2 (two) times daily.   pravastatin  (PRAVACHOL ) 20 MG tablet Take 20 mg by mouth at bedtime.   timolol  (BETIMOL ) 0.25 % ophthalmic solution Place 1-2 drops into both eyes 2 (two) times daily.   citalopram  (CELEXA ) 40 MG tablet Take 40 mg by mouth daily.  (Patient not taking: Reported on 03/08/2024)   fluticasone  (FLONASE ) 50 MCG/ACT nasal spray  (Patient not taking: Reported on 03/08/2024)   No facility-administered encounter medications on file as of 03/08/2024.    Review of Systems  Constitutional:  Negative for activity change, appetite change, fatigue and unexpected weight change.  HENT:  Positive for hearing loss. Negative for congestion.   Eyes: Negative.   Respiratory:  Negative for cough and shortness of breath.   Cardiovascular:  Positive for leg swelling. Negative for chest pain and palpitations.  Gastrointestinal:  Negative for abdominal pain, constipation and diarrhea.  Genitourinary:  Positive for frequency. Negative for difficulty urinating and dysuria.  Musculoskeletal:  Negative for arthralgias and myalgias.  Skin:  Negative for color change and wound.  Neurological:  Positive for dizziness. Negative for weakness.  Psychiatric/Behavioral:  Positive for confusion. Negative for agitation and behavioral problems.      Immunization History  Administered Date(s) Administered   Influenza Inj Mdck Quad Pf 03/29/2022   Influenza-Unspecified 04/12/2023, 03/06/2024   PFIZER Comirnaty(Gray Top)Covid-19 Tri-Sucrose Vaccine 06/23/2019, 07/24/2019   Pneumococcal Conjugate-13 03/26/2014   Pneumococcal Polysaccharide-23 03/02/2013   Pertinent  Health Maintenance Due  Topic Date Due   Influenza Vaccine  Completed   DEXA SCAN  Completed       03/23/2020    9:27 PM 03/24/2020    6:46 PM 03/25/2020    3:41 AM 03/25/2020    1:11 PM  Fall Risk  (RETIRED) Patient Fall Risk Level High fall risk  High fall risk  High fall risk  High fall risk      Data saved with a previous flowsheet row definition   Functional Status Survey:    Vitals:   03/08/24 1212  BP: 139/69  Pulse: 80  Resp: 16  Temp: 98.2 F (36.8 C)  SpO2: 97%  Weight: 167 lb 6.4 oz (75.9 kg)  Height: 5' 3 (1.6 m)   Body mass index is 29.65 kg/m. Physical Exam Constitutional:      General: She is not in acute distress.    Appearance: She is well-developed. She is not diaphoretic.  HENT:     Head: Normocephalic and atraumatic.     Mouth/Throat:     Pharynx: No oropharyngeal exudate.  Eyes:     Conjunctiva/sclera: Conjunctivae normal.     Pupils: Pupils are equal, round, and  reactive to light.  Cardiovascular:     Rate and Rhythm: Normal rate and regular rhythm.     Heart sounds: Normal heart sounds.  Pulmonary:     Effort: Pulmonary effort is normal.     Breath sounds: Normal breath sounds.  Abdominal:     General: Bowel sounds are normal.     Palpations: Abdomen is soft.  Musculoskeletal:     Cervical back: Normal range of motion and neck supple.     Right lower leg: Edema present.     Left lower leg: Edema present.  Skin:    General: Skin is warm and dry.  Neurological:     Mental Status: She is alert.  Psychiatric:        Mood and Affect: Mood normal.     Labs reviewed: Recent Labs    12/29/23 0000  NA 141  K 4.4  CL 108  CO2 25*  BUN 24*  CREATININE 0.9  CALCIUM  10.2   Recent Labs    12/29/23 0000  AST 15  ALT 9  ALKPHOS 59  ALBUMIN 3.9   Recent Labs    12/29/23 0000 01/05/24 0000  WBC 9.8 7.8  NEUTROABS 5,949.00  --   HGB 11.4* 10.8*  HCT 36 35*  PLT 241 266   No results found for: TSH No results found for: HGBA1C Lab Results  Component Value Date   CHOL 122 02/16/2024   HDL 48 02/16/2024    LDLCALC 58 02/16/2024   TRIG 80 02/16/2024    Significant Diagnostic Results in last 30 days:  No results found.  Assessment/Plan No problem-specific Assessment & Plan notes found for this encounter.  1. Stage 3a chronic kidney disease (HCC) (Primary) -Chronic and stable Encourage proper hydration Follow metabolic panel Avoid nephrotoxic meds (NSAIDS)  2. Primary hypertension Not controled with addition of norvasc, now with worsening LE edema Will stop norvasc due to swelling Lisiniopril increase to 40 mg daily  Follow up bmp in a week  3. Depression with anxiety Ongoing still getting used to facility. Has support of family and staff Continues on celexa    4. Chronic tension-type headache, not intractable Ongoing, continues on tylenol  PRN   5. Mixed Alzheimer's and vascular dementia (HCC) -Stable, no acute changes in cognitive or functional status, continue supportive care.   6. B12 deficiency Continues on oral supplement Follow up b12 and cbc next lab day  7. Intermittent vertigo Doing vestibular rehab through [facility]- will see if this can be done at [facility]  8. Overactive bladder Ongoing, but worse when changed off myrbetriq   Continue current regimen     Tamera Pingley K. Caro BODILY [facility] [phone]

## 2024-03-15 LAB — IRON,TIBC AND FERRITIN PANEL
%SAT: 22
Iron: 76
TIBC: 338

## 2024-03-15 LAB — BASIC METABOLIC PANEL WITH GFR
BUN: 20 (ref 4–21)
CO2: 26 — AB (ref 13–22)
Chloride: 109 — AB (ref 99–108)
Creatinine: 0.7 (ref 0.5–1.1)
Glucose: 135
Potassium: 4.1 meq/L (ref 3.5–5.1)
Sodium: 142 (ref 137–147)

## 2024-03-15 LAB — COMPREHENSIVE METABOLIC PANEL WITH GFR
Calcium: 10.1 (ref 8.7–10.7)
eGFR: 74

## 2024-03-15 LAB — VITAMIN B12: Vitamin B-12: 713

## 2024-04-02 ENCOUNTER — Non-Acute Institutional Stay (SKILLED_NURSING_FACILITY): Payer: Self-pay | Admitting: Orthopedic Surgery

## 2024-04-02 DIAGNOSIS — G309 Alzheimer's disease, unspecified: Secondary | ICD-10-CM

## 2024-04-02 DIAGNOSIS — F0280 Dementia in other diseases classified elsewhere without behavioral disturbance: Secondary | ICD-10-CM

## 2024-04-02 DIAGNOSIS — F339 Major depressive disorder, recurrent, unspecified: Secondary | ICD-10-CM

## 2024-04-02 DIAGNOSIS — E782 Mixed hyperlipidemia: Secondary | ICD-10-CM | POA: Diagnosis not present

## 2024-04-02 DIAGNOSIS — I10 Essential (primary) hypertension: Secondary | ICD-10-CM

## 2024-04-02 DIAGNOSIS — E538 Deficiency of other specified B group vitamins: Secondary | ICD-10-CM

## 2024-04-02 DIAGNOSIS — N182 Chronic kidney disease, stage 2 (mild): Secondary | ICD-10-CM

## 2024-04-02 DIAGNOSIS — N3281 Overactive bladder: Secondary | ICD-10-CM

## 2024-04-02 DIAGNOSIS — F0150 Vascular dementia without behavioral disturbance: Secondary | ICD-10-CM

## 2024-04-02 DIAGNOSIS — H906 Mixed conductive and sensorineural hearing loss, bilateral: Secondary | ICD-10-CM

## 2024-04-02 DIAGNOSIS — G4733 Obstructive sleep apnea (adult) (pediatric): Secondary | ICD-10-CM

## 2024-04-03 ENCOUNTER — Encounter: Payer: Self-pay | Admitting: Orthopedic Surgery

## 2024-04-03 NOTE — Progress Notes (Signed)
 This encounter was created in error - please disregard.

## 2024-04-03 NOTE — Progress Notes (Signed)
 Location:  Other Nursing Home Room Number: [facility] 305/A Place of Service:  SNF [phone]) Provider:  Greig FORBES Cluster, NP   Laurence Locus, DO  Patient Care Team: Laurence Locus, DO as PCP - General (Internal Medicine)  Extended Emergency Contact Information Primary Emergency Contact: Wakefield,David  [place] Home Phone: [phone] Work Phone: [phone] Relation: Son Secondary Emergency Contact: Burnetta (POA),Geraldine Address: [address]          [place], [place] [address] [place] Home Phone: [phone] Mobile Phone: [phone] Relation: Daughter  Code Status:  DNR Goals of care: Advanced Directive information    01/30/2024    3:08 PM  Advanced Directives  Does Patient Have a Medical Advance Directive? Yes  Type of Advance Directive Out of facility DNR (pink MOST or yellow form)  Does patient want to make changes to medical advance directive? No - Patient declined     Chief Complaint  Patient presents with   Medical Management of Chronic Issues    HPI:  Pt is a 89 y.o. female seen today for medical management of chronic diseases.    She currently resides on the skilled nursing unit at [facility]. PMH: HTN, HLD, AD, OSA, benign neoplasm of colon, lumbar radiculopathy, h/o hip replacement, BPPV, trigeminal neuralgia, osteopenia depression and anxiety.   HTN- BUN/creat 24/0.9 12/29/2023, remains on lisinopril  HLD- remains on pravastatin  CKD- GFR 74 (10/02)> was 61 (07/17) AD - no recent MMSE, BIMS 6/15 03/29/2024, 12/2022 CT head noted mild chronic small vessel ischemic changes, no behaviors, ambulates with walker or w/c, not on medication OAB- remains on Myrbetriq  Depression- Na+ 142 03/15/2024, remains on celexa  OSA- does not use CPAP Hearing loss- R>L, wears hearing aids B12 deficiency- Vitamin B12 level 713 03/15/2024, remains on cyanocobalamin    Recent weights:  10/01- 168.6 lbs  09/09- 167.4 lbs  08/06- 163.4 lbs  Recent blood  pressures:  10/20- 147/78  10/15- 157/76  10/14- 159/78    Past Medical History:  Diagnosis Date   Anxiety    Arthritis    B12 deficiency    Baker's cyst    Rt leg   Elevated lipids    Environmental and seasonal allergies    GERD (gastroesophageal reflux disease)    High cholesterol    Hypertension    Neuropathy    Past Surgical History:  Procedure Laterality Date   ABDOMINAL HYSTERECTOMY     CATARACT EXTRACTION W/ INTRAOCULAR LENS IMPLANT Bilateral    CHOLECYSTECTOMY     HERNIA REPAIR     TOTAL HIP ARTHROPLASTY Left 10/27/2016   Procedure: TOTAL HIP ARTHROPLASTY;  Surgeon: Mardee Lynwood SQUIBB, MD;  Location: [facility] ORS;  Service: Orthopedics;  Laterality: Left;    Allergies  Allergen Reactions   Sulfamethoxazole-Trimethoprim Other (See Comments)    Hallucinations, upset stomach   Augmentin [Amoxicillin-Pot Clavulanate] Nausea Only, Rash and Other (See Comments)    GI upset GI upset Has patient had a PCN reaction causing immediate rash, facial/tongue/throat swelling, SOB or lightheadedness with hypotension:No Has patient had a PCN reaction causing severe rash involving mucus membranes or skin necrosis:No Has patient had a PCN reaction that required hospitalization:No Has patient had a PCN reaction occurring within the last 10 years:Yes--upset stomach If all of the above answers are NO, then may proceed with Cephalosporin use.    Oxycodone  Other (See Comments)    Memory loss, medication is too strong.    Outpatient Encounter Medications as of 04/02/2024  Medication Sig  acetaminophen  (TYLENOL ) 500 MG tablet Take 1,000 mg by mouth every 8 (eight) hours as needed (for pain.). Gel cap   amLODipine (NORVASC) 5 MG tablet Take 5 mg by mouth daily.   aspirin EC 81 MG tablet Take 81 mg by mouth daily. Every Tuesday and Thursday.   brinzolamide (AZOPT) 1 % ophthalmic suspension Place 1 drop into both eyes 2 (two) times daily.   citalopram  (CELEXA ) 10 MG tablet Take 10 mg by  mouth daily.   cyanocobalamin  (VITAMIN B12) 1000 MCG tablet Take 1,000 mcg by mouth daily.   Diclofenac Potassium 25 MG TABS Take 50 mg by mouth daily as needed.   lisinopril  (ZESTRIL ) 20 MG tablet Take 20 mg by mouth daily.   mirabegron  ER (MYRBETRIQ ) 50 MG TB24 tablet Take 50 mg by mouth daily.   Multiple Vitamins-Minerals (PRESERVISION AREDS PO) Take 1 tablet by mouth 2 (two) times daily.   pravastatin  (PRAVACHOL ) 20 MG tablet Take 20 mg by mouth at bedtime.   timolol  (BETIMOL ) 0.25 % ophthalmic solution Place 1-2 drops into both eyes 2 (two) times daily.   No facility-administered encounter medications on file as of 04/02/2024.    Review of Systems  Unable to perform ROS: Dementia    Immunization History  Administered Date(s) Administered   Influenza Inj Mdck Quad Pf 03/29/2022   Influenza-Unspecified 04/12/2023, 03/06/2024   PFIZER Comirnaty(Gray Top)Covid-19 Tri-Sucrose Vaccine 06/23/2019, 07/24/2019   Pneumococcal Conjugate-13 03/26/2014   Pneumococcal Polysaccharide-23 03/02/2013   Pertinent  Health Maintenance Due  Topic Date Due   Influenza Vaccine  Completed   DEXA SCAN  Completed      03/23/2020    9:27 PM 03/24/2020    6:46 PM 03/25/2020    3:41 AM 03/25/2020    1:11 PM  Fall Risk  (RETIRED) Patient Fall Risk Level High fall risk  High fall risk  High fall risk  High fall risk      Data saved with a previous flowsheet row definition   Functional Status Survey:    Vitals:   04/03/24 1858  BP: (!) 143/78  Pulse: 80  Resp: 19  Temp: (!) 96.4 F (35.8 C)  SpO2: 96%  Weight: 168 lb 9.6 oz (76.5 kg)  Height: 5' 3 (1.6 m)   Body mass index is 29.87 kg/m. Physical Exam Vitals reviewed.  Constitutional:      General: She is not in acute distress. HENT:     Head: Normocephalic.     Right Ear: There is no impacted cerumen.     Left Ear: There is no impacted cerumen.     Nose: Nose normal.     Mouth/Throat:     Mouth: Mucous membranes are moist.   Eyes:     General:        Right eye: No discharge.        Left eye: No discharge.  Cardiovascular:     Rate and Rhythm: Normal rate and regular rhythm.     Pulses: Normal pulses.     Heart sounds: Normal heart sounds.  Pulmonary:     Effort: Pulmonary effort is normal.     Breath sounds: Normal breath sounds.  Abdominal:     General: Bowel sounds are normal. There is no distension.     Palpations: Abdomen is soft.  Musculoskeletal:     Cervical back: Neck supple.     Right lower leg: No edema.     Left lower leg: No edema.  Skin:    General:  Skin is warm.     Capillary Refill: Capillary refill takes less than 2 seconds.  Neurological:     General: No focal deficit present.     Mental Status: She is alert. Mental status is at baseline.     Gait: Gait abnormal.  Psychiatric:        Mood and Affect: Mood normal.     Comments: Very pleasant, alert to self/familiar face, follows commands     Labs reviewed: Recent Labs    12/29/23 0000  NA 141  K 4.4  CL 108  CO2 25*  BUN 24*  CREATININE 0.9  CALCIUM  10.2   Recent Labs    12/29/23 0000  AST 15  ALT 9  ALKPHOS 59  ALBUMIN 3.9   Recent Labs    12/29/23 0000 01/05/24 0000  WBC 9.8 7.8  NEUTROABS 5,949.00  --   HGB 11.4* 10.8*  HCT 36 35*  PLT 241 266   No results found for: TSH No results found for: HGBA1C Lab Results  Component Value Date   CHOL 122 02/16/2024   HDL 48 02/16/2024   LDLCALC 58 02/16/2024   TRIG 80 02/16/2024    Significant Diagnostic Results in last 30 days:  No results found.  Assessment/Plan 1. Primary hypertension (Primary) - controlled with lisinopril   2. Mixed hyperlipidemia - LDL 58 03/08/2024 - ocnt pravastatin   3. Chronic kidney disease (CKD), stage 2 - encourage hydration with water - Avoid NSAIDS  4. Mixed Alzheimer's and vascular dementia (HCC) - followed by neuro - no behaviors - ambulates with walker - weight stable - recent BIMS 6/15 03/29/2024 -  cont skilled nursing   5. Overactive bladder - cont myrbetriq   6. Recurrent depression - no changes in mood - Na+ stable - cont Celexa   7. OSA (obstructive sleep apnea) - does not use CPAP  8. Mixed conductive and sensorineural hearing loss of both ears - bilateral hearing aids  9. Vitamin B12 deficiency - recent B12 level normal - cont cyanocobalamin      Family/ staff Communication: plan discussed with patient and nurse  Labs/tests ordered:  none

## 2024-05-01 ENCOUNTER — Encounter: Payer: Self-pay | Admitting: Nurse Practitioner

## 2024-05-01 ENCOUNTER — Non-Acute Institutional Stay (SKILLED_NURSING_FACILITY): Payer: Self-pay | Admitting: Nurse Practitioner

## 2024-05-01 DIAGNOSIS — F0150 Vascular dementia without behavioral disturbance: Secondary | ICD-10-CM

## 2024-05-01 DIAGNOSIS — E782 Mixed hyperlipidemia: Secondary | ICD-10-CM | POA: Insufficient documentation

## 2024-05-01 DIAGNOSIS — E538 Deficiency of other specified B group vitamins: Secondary | ICD-10-CM

## 2024-05-01 DIAGNOSIS — N182 Chronic kidney disease, stage 2 (mild): Secondary | ICD-10-CM | POA: Insufficient documentation

## 2024-05-01 DIAGNOSIS — I10 Essential (primary) hypertension: Secondary | ICD-10-CM

## 2024-05-01 DIAGNOSIS — F339 Major depressive disorder, recurrent, unspecified: Secondary | ICD-10-CM | POA: Insufficient documentation

## 2024-05-01 DIAGNOSIS — N3281 Overactive bladder: Secondary | ICD-10-CM | POA: Diagnosis not present

## 2024-05-01 DIAGNOSIS — F3341 Major depressive disorder, recurrent, in partial remission: Secondary | ICD-10-CM | POA: Insufficient documentation

## 2024-05-01 DIAGNOSIS — H906 Mixed conductive and sensorineural hearing loss, bilateral: Secondary | ICD-10-CM | POA: Insufficient documentation

## 2024-05-01 DIAGNOSIS — G309 Alzheimer's disease, unspecified: Secondary | ICD-10-CM

## 2024-05-01 DIAGNOSIS — F0280 Dementia in other diseases classified elsewhere without behavioral disturbance: Secondary | ICD-10-CM

## 2024-05-01 NOTE — Assessment & Plan Note (Signed)
 Elevated- she was on norvasc and this was stopped due to LE edema. Lisinopril  was increased to 40 mg daily but she is still not at goal Will titrate myrbetriq  off to see if this is adding to htn

## 2024-05-01 NOTE — Progress Notes (Signed)
 Location:  Other [facility]) Nursing Home Room Number: [id number] Place of Service:  SNF ([id number])  Sarah Moon K. Caro, NP   Patient Care Team: Laurence Locus, DO as PCP - General (Internal Medicine)  Extended Emergency Contact Information Primary Emergency Contact: Hanser,David  [place] Home Phone: [phone] Work Phone: [phone] Relation: Son Secondary Emergency Contact: Burnetta (POA),Geraldine Address: [address]          [place], [place] [address] [place] Home Phone: [phone] Mobile Phone: [phone] Relation: Daughter  Goals of care: Advanced Directive information    01/30/2024    3:08 PM  Advanced Directives  Does Patient Have a Medical Advance Directive? Yes  Type of Advance Directive Out of facility DNR (pink MOST or yellow form)  Does patient want to make changes to medical advance directive? No - Patient declined     Chief Complaint  Patient presents with   Medical Management of Chronic Issues    Routine visit. Discuss need for covid booster, AWV, td/tdap, and shingrix.     HPI:  Pt is a 89 y.o. female seen today for medical management of chronic disease. Pt with hx dementia, CKD, htn, OAB.   Reports she is doing well- she states she likes to sleep a lot Reports her mood is getting better- would like to be home but states this is the next best place.  Reports she continues to have OAB - reports she can not tell much of a difference on myrbetrq  She feels like she is still up going to the bathroom frequently.  No pain noted Reports she sleeps well at night.   sbp have been 140s-150  No headaches Son reports some dizziness this morning.   Nursing reports she does well- no concerns at this time.    Past Medical History:  Diagnosis Date   Anxiety    Arthritis    B12 deficiency    Baker's cyst    Rt leg   Elevated lipids    Environmental and seasonal allergies    GERD (gastroesophageal reflux disease)    High  cholesterol    Hypertension    Neuropathy    Past Surgical History:  Procedure Laterality Date   ABDOMINAL HYSTERECTOMY     CATARACT EXTRACTION W/ INTRAOCULAR LENS IMPLANT Bilateral    CHOLECYSTECTOMY     HERNIA REPAIR     TOTAL HIP ARTHROPLASTY Left 10/27/2016   Procedure: TOTAL HIP ARTHROPLASTY;  Surgeon: Mardee Lynwood SQUIBB, MD;  Location: [facility] ORS;  Service: Orthopedics;  Laterality: Left;    Allergies  Allergen Reactions   Sulfamethoxazole-Trimethoprim Other (See Comments)    Hallucinations, upset stomach   Augmentin [Amoxicillin-Pot Clavulanate] Nausea Only, Rash and Other (See Comments)    GI upset GI upset Has patient had a PCN reaction causing immediate rash, facial/tongue/throat swelling, SOB or lightheadedness with hypotension:No Has patient had a PCN reaction causing severe rash involving mucus membranes or skin necrosis:No Has patient had a PCN reaction that required hospitalization:No Has patient had a PCN reaction occurring within the last 10 years:Yes--upset stomach If all of the above answers are NO, then may proceed with Cephalosporin use.    Oxycodone  Other (See Comments)    Memory loss, medication is too strong.    Outpatient Encounter Medications as of 05/01/2024  Medication Sig   acetaminophen  (TYLENOL ) 500 MG tablet Take 1,000 mg by mouth every 8 (eight) hours as needed (for pain.). Gel cap   aspirin EC 81 MG tablet  Take 81 mg by mouth daily. Every Tuesday and Thursday.   brinzolamide (AZOPT) 1 % ophthalmic suspension Place 1 drop into both eyes 2 (two) times daily.   citalopram  (CELEXA ) 10 MG tablet Take 10 mg by mouth daily.   cyanocobalamin  (VITAMIN B12) 1000 MCG tablet Take 1,000 mcg by mouth daily.   diclofenac (VOLTAREN) 50 MG EC tablet Take 50 mg by mouth daily. As needed for arthritis pain   lisinopril  (ZESTRIL ) 40 MG tablet Take 40 mg by mouth daily.   mirabegron  ER (MYRBETRIQ ) 50 MG TB24 tablet Take 50 mg by mouth daily.   Multiple  Vitamins-Minerals (PRESERVISION AREDS PO) Take 1 tablet by mouth 2 (two) times daily.   pravastatin  (PRAVACHOL ) 20 MG tablet Take 20 mg by mouth at bedtime.   timolol  (BETIMOL ) 0.25 % ophthalmic solution Place 1-2 drops into both eyes 2 (two) times daily.   Diclofenac Potassium 25 MG TABS Take 50 mg by mouth daily as needed. (Patient not taking: Reported on 05/01/2024)   lisinopril  (ZESTRIL ) 20 MG tablet Take 20 mg by mouth daily. (Patient not taking: Reported on 05/01/2024)   No facility-administered encounter medications on file as of 05/01/2024.    Review of Systems  Constitutional:  Negative for activity change, appetite change, fatigue and unexpected weight change.  HENT:  Negative for congestion and hearing loss.   Eyes: Negative.   Respiratory:  Negative for cough and shortness of breath.   Cardiovascular:  Negative for chest pain, palpitations and leg swelling.  Gastrointestinal:  Negative for abdominal pain, constipation and diarrhea.  Genitourinary:  Positive for frequency. Negative for difficulty urinating and dysuria.  Musculoskeletal:  Negative for arthralgias and myalgias.  Skin:  Negative for color change and wound.  Neurological:  Negative for dizziness and weakness.  Psychiatric/Behavioral:  Negative for agitation, behavioral problems and confusion.      Immunization History  Administered Date(s) Administered   Influenza Inj Mdck Quad Pf 03/29/2022   Influenza-Unspecified 04/12/2023, 03/06/2024   PFIZER Comirnaty(Gray Top)Covid-19 Tri-Sucrose Vaccine 06/23/2019, 07/24/2019   Pneumococcal Conjugate-13 03/26/2014   Pneumococcal Polysaccharide-23 03/02/2013   Pertinent  Health Maintenance Due  Topic Date Due   Influenza Vaccine  Completed   DEXA SCAN  Completed      03/23/2020    9:27 PM 03/24/2020    6:46 PM 03/25/2020    3:41 AM 03/25/2020    1:11 PM 04/03/2024    7:20 PM  Fall Risk  Falls in the past year?     1  Was there an injury with Fall?     0   Fall Risk Category Calculator     1  (RETIRED) Patient Fall Risk Level High fall risk  High fall risk  High fall risk  High fall risk    Patient at Risk for Falls Due to     History of fall(s);Impaired balance/gait  Fall risk Follow up     Falls evaluation completed     Data saved with a previous flowsheet row definition   Functional Status Survey:    Vitals:   05/01/24 1413 05/01/24 1416  BP: (S) (!) 151/77 (!) 157/87  Pulse: 68   SpO2: 96%   Weight: 168 lb 12.8 oz (76.6 kg)   Height: 5' 3 (1.6 m)    Body mass index is 29.9 kg/m. Physical Exam Constitutional:      General: She is not in acute distress.    Appearance: She is well-developed. She is not diaphoretic.  HENT:  Head: Normocephalic and atraumatic.     Mouth/Throat:     Pharynx: No oropharyngeal exudate.  Eyes:     Conjunctiva/sclera: Conjunctivae normal.     Pupils: Pupils are equal, round, and reactive to light.  Cardiovascular:     Rate and Rhythm: Normal rate and regular rhythm.     Heart sounds: Normal heart sounds.  Pulmonary:     Effort: Pulmonary effort is normal.     Breath sounds: Normal breath sounds.  Abdominal:     General: Bowel sounds are normal.     Palpations: Abdomen is soft.  Musculoskeletal:     Cervical back: Normal range of motion and neck supple.     Right lower leg: No edema.     Left lower leg: No edema.  Skin:    General: Skin is warm and dry.  Neurological:     Mental Status: She is alert.  Psychiatric:        Mood and Affect: Mood normal.     Labs reviewed: Recent Labs    12/29/23 0000 03/15/24 0000  NA 141 142  K 4.4 4.1  CL 108 109*  CO2 25* 26*  BUN 24* 20  CREATININE 0.9 0.7  CALCIUM  10.2 10.1   Recent Labs    12/29/23 0000  AST 15  ALT 9  ALKPHOS 59  ALBUMIN 3.9   Recent Labs    12/29/23 0000 01/05/24 0000  WBC 9.8 7.8  NEUTROABS 5,949.00  --   HGB 11.4* 10.8*  HCT 36 35*  PLT 241 266   No results found for: TSH No results found for:  HGBA1C Lab Results  Component Value Date   CHOL 122 02/16/2024   HDL 48 02/16/2024   LDLCALC 58 02/16/2024   TRIG 80 02/16/2024    Significant Diagnostic Results in last 30 days:  No results found.  Assessment/Plan Hypertension Elevated- she was on norvasc and this was stopped due to LE edema. Lisinopril  was increased to 40 mg daily but she is still not at goal Will titrate myrbetriq  off to see if this is adding to htn   Vitamin B12 deficiency Continues on b12 supplement Level at goal  Recurrent depression Stable on celexa   Overactive bladder She is currently on myrbetriq  She reports she continues to have issues with OAB despite medication Her bp is also been more elevated- will titrate and stop to see if this helps BP Also monitor for increase in OAB symptoms.  Mixed hyperlipidemia Continues on pravastatin  LDL at goal on last labs   Mixed conductive and sensorineural hearing loss of both ears Ongoing, has hearing aides Continue supportive care.   Mixed Alzheimer's and vascular dementia (HCC) Stable, no acute changes in cognitive or functional status, continue supportive care.   Chronic kidney disease (CKD), stage 2 Chronic and stable Encourage proper hydration Follow metabolic panel Avoid nephrotoxic meds (NSAIDS)     [place]ie Sarah Moon BODILY [facility] [phone]

## 2024-05-01 NOTE — Assessment & Plan Note (Signed)
 She is currently on myrbetriq  She reports she continues to have issues with OAB despite medication Her bp is also been more elevated- will titrate and stop to see if this helps BP Also monitor for increase in OAB symptoms.

## 2024-05-01 NOTE — Assessment & Plan Note (Signed)
Stable on celexa

## 2024-05-01 NOTE — Assessment & Plan Note (Signed)
 Chronic and stable Encourage proper hydration Follow metabolic panel Avoid nephrotoxic meds (NSAIDS)

## 2024-05-01 NOTE — Assessment & Plan Note (Signed)
 Continues on b12 supplement Level at goal

## 2024-05-01 NOTE — Assessment & Plan Note (Signed)
 Stable, no acute changes in cognitive or functional status, continue supportive care.

## 2024-05-01 NOTE — Assessment & Plan Note (Signed)
 Ongoing, has hearing aides Continue supportive care.

## 2024-05-01 NOTE — Assessment & Plan Note (Signed)
 Continues on pravastatin  LDL at goal on last labs

## 2024-05-15 ENCOUNTER — Non-Acute Institutional Stay (SKILLED_NURSING_FACILITY): Payer: Self-pay | Admitting: Nurse Practitioner

## 2024-05-15 ENCOUNTER — Encounter: Payer: Self-pay | Admitting: Nurse Practitioner

## 2024-05-15 DIAGNOSIS — I10 Essential (primary) hypertension: Secondary | ICD-10-CM

## 2024-05-15 DIAGNOSIS — N3281 Overactive bladder: Secondary | ICD-10-CM | POA: Diagnosis not present

## 2024-05-15 NOTE — Assessment & Plan Note (Signed)
 Increase in OAB symptoms stince stopping myrbetriq  but improvement in BP Will start gemtesa 75 mg daily at this time and monitor for symptoms If not effective consider vesicare

## 2024-05-15 NOTE — Assessment & Plan Note (Signed)
 Elevated- she was on norvasc and this was stopped due to LE edema. Lisinopril  was increased to 40 mg daily but she was still not at goal so mybetriq was stopped.  Stopping myrbetriq  has improved blood pressure- will continue current regimen and monitor.

## 2024-05-15 NOTE — Progress Notes (Signed)
 Location:  Other [facility].  Nursing Home Room Number: [facility] [id number] Place of Service:  SNF [phone]) Harlene An, NP  PCP: Laurence Locus, DO  Patient Care Team: Laurence Locus, DO as PCP - General (Internal Medicine)  Extended Emergency Contact Information Primary Emergency Contact: Danielson,David  [place] Home Phone: [phone] Work Phone: [phone] Relation: Son Secondary Emergency Contact: Burnetta (POA),Geraldine Address: [address]          [place], [place] [address] [place] Home Phone: [phone] Mobile Phone: [phone] Relation: Daughter  Goals of care: Advanced Directive information    05/15/2024    3:30 PM  Advanced Directives  Does Patient Have a Medical Advance Directive? Yes  Type of Advance Directive Out of facility DNR (pink MOST or yellow form)  Does patient want to make changes to medical advance directive? No - Patient declined     Chief Complaint  Patient presents with   Urinary Frequency    Urinary Frequency     HPI:  Pt is a 89 y.o. female seen today for an acute visit for Urinary Frequency.  Son reports after stopping myrbetriq  her urinary frequency and incontinence has gotten much worse.  Myrbetriq  was stopped due to ongoing urinary frequency per patient- she did not feel like it was helping due to frequent trips to the bathroom and elevated blood pressure  However since stopping frequency has increased and she is having night time accidents which is very embarrassing for her.   Her blood pressure has improved since stopping myrbetriq  and now sbp 130s after medication.    Past Medical History:  Diagnosis Date   Anxiety    Arthritis    B12 deficiency    Baker's cyst    Rt leg   Elevated lipids    Environmental and seasonal allergies    GERD (gastroesophageal reflux disease)    High cholesterol    Hypertension    Neuropathy    Past Surgical History:  Procedure Laterality Date   ABDOMINAL  HYSTERECTOMY     CATARACT EXTRACTION W/ INTRAOCULAR LENS IMPLANT Bilateral    CHOLECYSTECTOMY     HERNIA REPAIR     TOTAL HIP ARTHROPLASTY Left 10/27/2016   Procedure: TOTAL HIP ARTHROPLASTY;  Surgeon: Mardee Lynwood SQUIBB, MD;  Location: [facility] ORS;  Service: Orthopedics;  Laterality: Left;    Allergies  Allergen Reactions   Sulfamethoxazole-Trimethoprim Other (See Comments)    Hallucinations, upset stomach   Augmentin [Amoxicillin-Pot Clavulanate] Nausea Only, Rash and Other (See Comments)    GI upset GI upset Has patient had a PCN reaction causing immediate rash, facial/tongue/throat swelling, SOB or lightheadedness with hypotension:No Has patient had a PCN reaction causing severe rash involving mucus membranes or skin necrosis:No Has patient had a PCN reaction that required hospitalization:No Has patient had a PCN reaction occurring within the last 10 years:Yes--upset stomach If all of the above answers are NO, then may proceed with Cephalosporin use.    Oxycodone  Other (See Comments)    Memory loss, medication is too strong.    Outpatient Encounter Medications as of 05/15/2024  Medication Sig   acetaminophen  (TYLENOL ) 500 MG tablet Take 1,000 mg by mouth every 8 (eight) hours as needed (for pain.). Gel cap   aspirin EC 81 MG tablet Take 81 mg by mouth daily. Every Tuesday and Thursday.   brinzolamide (AZOPT) 1 % ophthalmic suspension Place 1 drop into both eyes 2 (two) times daily.   citalopram  (CELEXA ) 10 MG tablet Take  10 mg by mouth daily.   cyanocobalamin  (VITAMIN B12) 1000 MCG tablet Take 1,000 mcg by mouth daily.   diclofenac (VOLTAREN) 50 MG EC tablet Take 50 mg by mouth daily. As needed for arthritis pain   lisinopril  (ZESTRIL ) 40 MG tablet Take 40 mg by mouth daily.   Multiple Vitamins-Minerals (PRESERVISION AREDS PO) Take 1 tablet by mouth 2 (two) times daily.   pravastatin  (PRAVACHOL ) 20 MG tablet Take 20 mg by mouth at bedtime.   timolol  (BETIMOL ) 0.25 % ophthalmic  solution Place 1-2 drops into both eyes 2 (two) times daily.   Vibegron (GEMTESA) 75 MG TABS Take 1 tablet by mouth daily.   mirabegron  ER (MYRBETRIQ ) 50 MG TB24 tablet Take 50 mg by mouth daily. (Patient not taking: Reported on 05/15/2024)   No facility-administered encounter medications on file as of 05/15/2024.    Review of Systems  Constitutional:  Negative for activity change, appetite change, fatigue and unexpected weight change.  HENT:  Negative for congestion and hearing loss.   Eyes: Negative.   Respiratory:  Negative for cough and shortness of breath.   Cardiovascular:  Negative for chest pain, palpitations and leg swelling.  Gastrointestinal:  Negative for constipation and diarrhea.  Genitourinary:  Positive for frequency and urgency. Negative for difficulty urinating and dysuria.  Skin:  Negative for color change and wound.  Neurological:  Negative for dizziness.    Immunization History  Administered Date(s) Administered   Influenza Inj Mdck Quad Pf 03/29/2022   Influenza-Unspecified 04/12/2023, 03/06/2024   PFIZER Comirnaty(Gray Top)Covid-19 Tri-Sucrose Vaccine 06/23/2019, 07/24/2019   Pneumococcal Conjugate-13 03/26/2014   Pneumococcal Polysaccharide-23 03/02/2013   Pertinent  Health Maintenance Due  Topic Date Due   Influenza Vaccine  Completed   Bone Density Scan  Completed      03/23/2020    9:27 PM 03/24/2020    6:46 PM 03/25/2020    3:41 AM 03/25/2020    1:11 PM 04/03/2024    7:20 PM  Fall Risk  Falls in the past year?     1  Was there an injury with Fall?     0   Fall Risk Category Calculator     1  (RETIRED) Patient Fall Risk Level High fall risk  High fall risk  High fall risk  High fall risk    Patient at Risk for Falls Due to     History of fall(s);Impaired balance/gait  Fall risk Follow up     Falls evaluation completed     Data saved with a previous flowsheet row definition   Functional Status Survey:    Vitals:   05/15/24 1505 05/15/24  1636  BP: (!) 141/77 134/74  Pulse: 78   Resp: 18   Temp: 98.3 F (36.8 C)   SpO2: 95%   Weight: 168 lb 12.8 oz (76.6 kg)   Height: 5' 3 (1.6 m)    Body mass index is 29.9 kg/m. Physical Exam Constitutional:      Appearance: Normal appearance.  Cardiovascular:     Rate and Rhythm: Normal rate and regular rhythm.  Pulmonary:     Effort: Pulmonary effort is normal.  Neurological:     Mental Status: She is alert. Mental status is at baseline.  Psychiatric:        Mood and Affect: Mood normal.     Labs reviewed: Recent Labs    12/29/23 0000 03/15/24 0000  NA 141 142  K 4.4 4.1  CL 108 109*  CO2 25* 26*  BUN 24* 20  CREATININE 0.9 0.7  CALCIUM  10.2 10.1   Recent Labs    12/29/23 0000  AST 15  ALT 9  ALKPHOS 59  ALBUMIN 3.9   Recent Labs    12/29/23 0000 01/05/24 0000  WBC 9.8 7.8  NEUTROABS 5,949.00  --   HGB 11.4* 10.8*  HCT 36 35*  PLT 241 266   No results found for: TSH No results found for: HGBA1C Lab Results  Component Value Date   CHOL 122 02/16/2024   HDL 48 02/16/2024   LDLCALC 58 02/16/2024   TRIG 80 02/16/2024    Significant Diagnostic Results in last 30 days:  No results found.  Assessment/Plan Overactive bladder Increase in OAB symptoms stince stopping myrbetriq  but improvement in BP Will start gemtesa 75 mg daily at this time and monitor for symptoms If not effective consider vesicare  Hypertension Elevated- she was on norvasc and this was stopped due to LE edema. Lisinopril  was increased to 40 mg daily but she was still not at goal so mybetriq was stopped.  Stopping myrbetriq  has improved blood pressure- will continue current regimen and monitor.     Hutch Rhett K. Caro BODILY [facility] Adult Medicine [phone]

## 2024-05-21 ENCOUNTER — Encounter: Payer: Self-pay | Admitting: Orthopedic Surgery

## 2024-05-21 ENCOUNTER — Non-Acute Institutional Stay (SKILLED_NURSING_FACILITY): Admitting: Orthopedic Surgery

## 2024-05-21 DIAGNOSIS — G309 Alzheimer's disease, unspecified: Secondary | ICD-10-CM | POA: Diagnosis not present

## 2024-05-21 DIAGNOSIS — J101 Influenza due to other identified influenza virus with other respiratory manifestations: Secondary | ICD-10-CM

## 2024-05-21 DIAGNOSIS — I10 Essential (primary) hypertension: Secondary | ICD-10-CM

## 2024-05-21 DIAGNOSIS — F0150 Vascular dementia without behavioral disturbance: Secondary | ICD-10-CM | POA: Diagnosis not present

## 2024-05-21 DIAGNOSIS — F0280 Dementia in other diseases classified elsewhere without behavioral disturbance: Secondary | ICD-10-CM

## 2024-05-21 MED ORDER — OSELTAMIVIR PHOSPHATE 30 MG PO CAPS: 30.0000 mg | ORAL_CAPSULE | Freq: Two times a day (BID) | ORAL | Status: AC

## 2024-05-21 NOTE — Progress Notes (Signed)
 Location:  Other Nursing Home Room Number: [facility] SN[facility] 305/A Place of Service:  SNF [id number]) Provider:  Greig FORBES Cluster, NP   Laurence Locus, DO  Patient Care Team: Laurence Locus, DO as PCP - General (Internal Medicine)  Extended Emergency Contact Information Primary Emergency Contact: Marconi,David  [place] Home Phone: [phone] Work Phone: [phone] Relation: Son Secondary Emergency Contact: Burnetta (POA),Geraldine Address: [address]          [place], [place] [address] [place] Home Phone: [phone] Mobile Phone: [phone] Relation: Daughter  Code Status:  DNR Goals of care: Advanced Directive information    05/15/2024    3:30 PM  Advanced Directives  Does Patient Have a Medical Advance Directive? Yes  Type of Advance Directive Out of facility DNR (pink MOST or yellow form)  Does patient want to make changes to medical advance directive? No - Patient declined     Chief Complaint  Patient presents with   Acute Visit    Cough and congestion    HPI:  Pt is a 89 y.o. female seen today for acute visit due to positive flu test.   She currently resides on the skilled nursing unit at [facility]. PMH: HTN, HLD, AD, OSA, benign neoplasm of colon, lumbar radiculopathy, h/o hip replacement, BPPV, trigeminal neuralgia, osteopenia depression and anxiety.   Poor historian due to dementia. 12/07 she had cough, nasal congestion and temp of 98.6. She was given robitussin prn for cough. This morning she c/o malaise and body aches. Rapid flu/covid test positive for influenza. She denies chest pain, sob, sore throat or chills. Afebrile. Vitals stable.   HTN- remains on lisinopril  AD- no recent behaviors since falling ill, dependent with ADLs, not on medication      Past Medical History:  Diagnosis Date   Anxiety    Arthritis    B12 deficiency    Baker's cyst    Rt leg   Elevated lipids    Environmental and seasonal allergies    GERD  (gastroesophageal reflux disease)    High cholesterol    Hypertension    Neuropathy    Past Surgical History:  Procedure Laterality Date   ABDOMINAL HYSTERECTOMY     CATARACT EXTRACTION W/ INTRAOCULAR LENS IMPLANT Bilateral    CHOLECYSTECTOMY     HERNIA REPAIR     TOTAL HIP ARTHROPLASTY Left 10/27/2016   Procedure: TOTAL HIP ARTHROPLASTY;  Surgeon: Mardee Lynwood SQUIBB, MD;  Location: [facility] ORS;  Service: Orthopedics;  Laterality: Left;    Allergies  Allergen Reactions   Sulfamethoxazole-Trimethoprim Other (See Comments)    Hallucinations, upset stomach   Augmentin [Amoxicillin-Pot Clavulanate] Nausea Only, Rash and Other (See Comments)    GI upset GI upset Has patient had a PCN reaction causing immediate rash, facial/tongue/throat swelling, SOB or lightheadedness with hypotension:No Has patient had a PCN reaction causing severe rash involving mucus membranes or skin necrosis:No Has patient had a PCN reaction that required hospitalization:No Has patient had a PCN reaction occurring within the last 10 years:Yes--upset stomach If all of the above answers are NO, then may proceed with Cephalosporin use.    Oxycodone  Other (See Comments)    Memory loss, medication is too strong.    Outpatient Encounter Medications as of 05/21/2024  Medication Sig   acetaminophen  (TYLENOL ) 500 MG tablet Take 1,000 mg by mouth every 8 (eight) hours as needed (for pain.). Gel cap   aspirin EC 81 MG tablet Take 81 mg by mouth daily.  Every Tuesday and Thursday.   brinzolamide (AZOPT) 1 % ophthalmic suspension Place 1 drop into both eyes 2 (two) times daily.   citalopram  (CELEXA ) 10 MG tablet Take 10 mg by mouth daily.   cyanocobalamin  (VITAMIN B12) 1000 MCG tablet Take 1,000 mcg by mouth daily.   diclofenac (VOLTAREN) 50 MG EC tablet Take 50 mg by mouth daily. As needed for arthritis pain   lisinopril  (ZESTRIL ) 40 MG tablet Take 40 mg by mouth daily.   mirabegron  ER (MYRBETRIQ ) 50 MG TB24 tablet Take 50 mg  by mouth daily. (Patient not taking: Reported on 05/15/2024)   Multiple Vitamins-Minerals (PRESERVISION AREDS PO) Take 1 tablet by mouth 2 (two) times daily.   pravastatin  (PRAVACHOL ) 20 MG tablet Take 20 mg by mouth at bedtime.   timolol  (BETIMOL ) 0.25 % ophthalmic solution Place 1-2 drops into both eyes 2 (two) times daily.   Vibegron (GEMTESA) 75 MG TABS Take 1 tablet by mouth daily.   No facility-administered encounter medications on file as of 05/21/2024.    Review of Systems  Constitutional:  Positive for appetite change and fatigue. Negative for chills and fever.  HENT:  Positive for rhinorrhea. Negative for sore throat.   Respiratory:  Positive for cough. Negative for shortness of breath and wheezing.   Cardiovascular:  Negative for chest pain and leg swelling.  Gastrointestinal:  Negative for nausea and vomiting.  Genitourinary:  Negative for dysuria.  Musculoskeletal:  Positive for gait problem. Negative for myalgias.  Skin:  Negative for wound.  Neurological:  Positive for weakness. Negative for dizziness and headaches.  Psychiatric/Behavioral:  Positive for confusion. Negative for dysphoric mood and sleep disturbance. The patient is not nervous/anxious.     Immunization History  Administered Date(s) Administered   Influenza Inj Mdck Quad Pf 03/29/2022   Influenza-Unspecified 04/12/2023, 03/06/2024   PFIZER Comirnaty(Gray Top)Covid-19 Tri-Sucrose Vaccine 06/23/2019, 07/24/2019   Pneumococcal Conjugate-13 03/26/2014   Pneumococcal Polysaccharide-23 03/02/2013   Pertinent  Health Maintenance Due  Topic Date Due   Influenza Vaccine  Completed   Bone Density Scan  Completed      03/23/2020    9:27 PM 03/24/2020    6:46 PM 03/25/2020    3:41 AM 03/25/2020    1:11 PM 04/03/2024    7:20 PM  Fall Risk  Falls in the past year?     1  Was there an injury with Fall?     0   Fall Risk Category Calculator     1  (RETIRED) Patient Fall Risk Level High fall risk  High fall  risk  High fall risk  High fall risk    Patient at Risk for Falls Due to     History of fall(s);Impaired balance/gait  Fall risk Follow up     Falls evaluation completed     Data saved with a previous flowsheet row definition   Functional Status Survey:    Vitals:   05/21/24 1027  BP: (!) 148/92  Pulse: 95  Resp: 18  Temp: 98.6 F (37 C)  SpO2: 95%  Weight: 162 lb 12.8 oz (73.8 kg)  Height: 5' 3 (1.6 m)   Body mass index is 28.84 kg/m. Physical Exam Vitals reviewed.  Constitutional:      General: She is not in acute distress. HENT:     Head: Normocephalic.     Right Ear: Tympanic membrane normal.     Left Ear: Tympanic membrane normal.     Nose: Rhinorrhea present.     Mouth/Throat:  Mouth: Mucous membranes are moist.     Pharynx: No posterior oropharyngeal erythema.  Eyes:     General:        Right eye: No discharge.        Left eye: No discharge.  Cardiovascular:     Rate and Rhythm: Normal rate and regular rhythm.     Pulses: Normal pulses.     Heart sounds: Normal heart sounds.  Pulmonary:     Effort: Pulmonary effort is normal. No respiratory distress.     Breath sounds: Normal breath sounds. No wheezing, rhonchi or rales.  Abdominal:     General: Bowel sounds are normal.     Palpations: Abdomen is soft.  Musculoskeletal:     Cervical back: Neck supple.     Right lower leg: No edema.     Left lower leg: No edema.  Skin:    General: Skin is warm.     Capillary Refill: Capillary refill takes less than 2 seconds.  Neurological:     General: No focal deficit present.     Mental Status: She is alert. Mental status is at baseline.     Gait: Gait abnormal.  Psychiatric:        Mood and Affect: Mood normal.     Labs reviewed: Recent Labs    12/29/23 0000 03/15/24 0000  NA 141 142  K 4.4 4.1  CL 108 109*  CO2 25* 26*  BUN 24* 20  CREATININE 0.9 0.7  CALCIUM  10.2 10.1   Recent Labs    12/29/23 0000  AST 15  ALT 9  ALKPHOS 59  ALBUMIN  3.9   Recent Labs    12/29/23 0000 01/05/24 0000  WBC 9.8 7.8  NEUTROABS 5,949.00  --   HGB 11.4* 10.8*  HCT 36 35*  PLT 241 266   No results found for: TSH No results found for: HGBA1C Lab Results  Component Value Date   CHOL 122 02/16/2024   HDL 48 02/16/2024   LDLCALC 58 02/16/2024   TRIG 80 02/16/2024    Significant Diagnostic Results in last 30 days:  No results found.  Assessment/Plan 1. Influenza A (Primary) - 12/07 malaise, cough and congestion, temp 98.6 - 12/08 + flu A - lung sounds clear  - GFR 74 (10/02) and 61 (07/17) - start vitamin C 1000 mg po daily  - start robitussin prn  - start droplet precautions - oseltamivir  (TAMIFLU ) 30 MG capsule; Take 1 capsule (30 mg total) by mouth 2 (two) times daily for 5 days.  2. Primary hypertension - controlled with lisinopril   3. Mixed Alzheimer's and vascular dementia (HCC) - no behaviors - dependent with ADLs except feeding  - not on medication - cont skilled nursing     Family/ staff Communication: plan discussed with patient and nurse   Labs/tests ordered:  none

## 2024-06-04 ENCOUNTER — Non-Acute Institutional Stay (SKILLED_NURSING_FACILITY): Payer: Self-pay | Admitting: Internal Medicine

## 2024-06-04 ENCOUNTER — Encounter: Payer: Self-pay | Admitting: Internal Medicine

## 2024-06-04 DIAGNOSIS — E538 Deficiency of other specified B group vitamins: Secondary | ICD-10-CM | POA: Diagnosis not present

## 2024-06-04 DIAGNOSIS — G309 Alzheimer's disease, unspecified: Secondary | ICD-10-CM | POA: Diagnosis not present

## 2024-06-04 DIAGNOSIS — F418 Other specified anxiety disorders: Secondary | ICD-10-CM | POA: Diagnosis not present

## 2024-06-04 DIAGNOSIS — F3341 Major depressive disorder, recurrent, in partial remission: Secondary | ICD-10-CM

## 2024-06-04 DIAGNOSIS — F0280 Dementia in other diseases classified elsewhere without behavioral disturbance: Secondary | ICD-10-CM

## 2024-06-04 DIAGNOSIS — H906 Mixed conductive and sensorineural hearing loss, bilateral: Secondary | ICD-10-CM | POA: Diagnosis not present

## 2024-06-04 DIAGNOSIS — I10 Essential (primary) hypertension: Secondary | ICD-10-CM | POA: Diagnosis not present

## 2024-06-04 DIAGNOSIS — F0150 Vascular dementia without behavioral disturbance: Secondary | ICD-10-CM | POA: Diagnosis not present

## 2024-06-04 DIAGNOSIS — N1831 Chronic kidney disease, stage 3a: Secondary | ICD-10-CM

## 2024-06-04 DIAGNOSIS — E782 Mixed hyperlipidemia: Secondary | ICD-10-CM | POA: Diagnosis not present

## 2024-06-04 DIAGNOSIS — N3281 Overactive bladder: Secondary | ICD-10-CM

## 2024-06-04 NOTE — Assessment & Plan Note (Signed)
 Patient was started on Gemtesa 75 mg daily recently.

## 2024-06-04 NOTE — Assessment & Plan Note (Signed)
 Patient remains on Celexa  10 mg/day.

## 2024-06-04 NOTE — Assessment & Plan Note (Signed)
 Continue on 1000 mcg of B12.

## 2024-06-04 NOTE — Assessment & Plan Note (Signed)
 Continue Celexa 10mg  daily.

## 2024-06-04 NOTE — Assessment & Plan Note (Signed)
 Stable.  She is not on any dementia meds.

## 2024-06-04 NOTE — Assessment & Plan Note (Signed)
 Patient mains on pravastatin  20 mg daily

## 2024-06-04 NOTE — Assessment & Plan Note (Signed)
 Patient needs to wear hearing aids at all times.

## 2024-06-04 NOTE — Progress Notes (Signed)
 [facility] Routine Visit Progress Note    Location:  Other [place]  Nursing Home Room Number: [facility] [id number] Place of Service:  SNF (31)   ERE:Ryzw, Camellia, DO   Patient Care Team: Laurence Camellia, DO as PCP - General (Internal Medicine)   Extended Emergency Contact Information Primary Emergency Contact: [facility] Address: [address]            [place], [place] [address] [place] Phone: [phone] Relation: Son Secondary Emergency Contact: Burnetta (POA),Geraldine Address: [address]          [place], [place] [address] [place] Mobile Phone: [phone] Relation: Daughter   Goals of care: Advanced Directive information    05/15/2024    3:30 PM  Advanced Directives  Does Patient Have a Medical Advance Directive? Yes  Type of Advance Directive Out of facility DNR (pink MOST or yellow form)  Does patient want to make changes to medical advance directive? No - Patient declined    CODE STATUS: Do Not Resuscitate (DNR)   Chief Complaint  Patient presents with   Medical Management of Chronic Issues    Medical Management of Chronic Issues.      HPI: Pt is a 89 y.o. female seen today for medical management of chronic disease.   89 year old female with a history of overactive, bilateral hearing loss, essential hypertension, CKD stage IIIa, history of major depression with anxiety, mild dementia both Alzheimer's and vascular who is seen for routine medical care.  Her daughter Liane Tribbey is at the bedside.  Patient was recovering from the flu that she had last week.  She is feeling better.  She was able to use a stationary bicycle today with physical therapy.  She still has a little bit of a cough but otherwise is feeling much better now.  Patient without any other health concerns.   Past Medical History:  Diagnosis Date   Anxiety    Arthritis    B12 deficiency    Baker's cyst    Rt leg   Elevated lipids     Environmental and seasonal allergies    GERD (gastroesophageal reflux disease)    High cholesterol    Hypertension    Neuropathy    Status post total replacement of hip 10/27/2016   Past Surgical History:  Procedure Laterality Date   ABDOMINAL HYSTERECTOMY     CATARACT EXTRACTION W/ INTRAOCULAR LENS IMPLANT Bilateral    CHOLECYSTECTOMY     HERNIA REPAIR     TOTAL HIP ARTHROPLASTY Left 10/27/2016   Procedure: TOTAL HIP ARTHROPLASTY;  Surgeon: Mardee Lynwood SQUIBB, MD;  Location: [facility] ORS;  Service: Orthopedics;  Laterality: Left;     Allergies[1]   Outpatient Encounter Medications as of 06/04/2024  Medication Sig   acetaminophen  (TYLENOL ) 500 MG tablet Take 1,000 mg by mouth every 8 (eight) hours as needed (for pain.). Gel cap   aspirin EC 81 MG tablet Take 81 mg by mouth daily. Every Tuesday and Thursday.   brinzolamide (AZOPT) 1 % ophthalmic suspension Place 1 drop into both eyes 2 (two) times daily.   citalopram  (CELEXA ) 10 MG tablet Take 10 mg by mouth daily.   cyanocobalamin  (VITAMIN B12) 1000 MCG tablet Take 1,000 mcg by mouth daily.   dextromethorphan-guaiFENesin (MUCINEX DM) 30-600 MG 12hr tablet Take 1 tablet by mouth 2 (two) times daily.   diclofenac (VOLTAREN) 50 MG EC tablet Take 50 mg by mouth daily. As needed for arthritis pain  guaiFENesin (ROBITUSSIN) 100 MG/5ML liquid Take 10 mLs by mouth every 4 (four) hours as needed for cough or to loosen phlegm.   lisinopril  (ZESTRIL ) 40 MG tablet Take 40 mg by mouth daily.   Multiple Vitamins-Minerals (PRESERVISION AREDS PO) Take 1 tablet by mouth 2 (two) times daily.   pravastatin  (PRAVACHOL ) 20 MG tablet Take 20 mg by mouth at bedtime.   timolol  (BETIMOL ) 0.25 % ophthalmic solution Place 1-2 drops into both eyes 2 (two) times daily.   Vibegron (GEMTESA) 75 MG TABS Take 1 tablet by mouth daily.   No facility-administered encounter medications on file as of 06/04/2024.     Review of Systems  Constitutional: Negative.   HENT:  Negative.    Eyes: Negative.   Respiratory:         Improving cough.  Cardiovascular: Negative.   Gastrointestinal: Negative.   Endocrine: Negative.   Genitourinary:        Chronic urinary incontinence  Musculoskeletal: Negative.   Allergic/Immunologic: Negative.   Neurological: Negative.   Hematological: Negative.   Psychiatric/Behavioral: Negative.    All other systems reviewed and are negative.     Immunization History  Administered Date(s) Administered   Influenza Inj Mdck Quad Pf 03/29/2022   Influenza-Unspecified 04/12/2023, 03/06/2024   PFIZER Comirnaty(Gray Top)Covid-19 Tri-Sucrose Vaccine 06/23/2019, 07/24/2019   Pneumococcal Conjugate-13 03/26/2014   Pneumococcal Polysaccharide-23 03/02/2013   Pertinent  Health Maintenance Due  Topic Date Due   Influenza Vaccine  Completed   Bone Density Scan  Completed      03/24/2020    6:46 PM 03/25/2020    3:41 AM 03/25/2020    1:11 PM 04/03/2024    7:20 PM 05/21/2024    1:43 PM  Fall Risk  Falls in the past year?    1 1  Was there an injury with Fall?    0  0  Fall Risk Category Calculator    1 1  (RETIRED) Patient Fall Risk Level High fall risk  High fall risk  High fall risk     Patient at Risk for Falls Due to    History of fall(s);Impaired balance/gait History of fall(s);Impaired balance/gait  Fall risk Follow up    Falls evaluation completed Falls evaluation completed     Data saved with a previous flowsheet row definition   Functional Status Survey:     Vitals:   06/04/24 1249  BP: (!) 143/74  Pulse: 70  Resp: 17  Temp: (!) 97.4 F (36.3 C)  SpO2: 98%  Weight: 162 lb 12.8 oz (73.8 kg)  Height: 5' 3 (1.6 m)   Body mass index is 28.84 kg/m. Physical Exam Vitals and nursing note reviewed.  Constitutional:      General: She is not in acute distress.    Appearance: She is normal weight. She is not toxic-appearing.  HENT:     Head: Normocephalic and atraumatic.     Nose: Nose normal.  Eyes:      General: No scleral icterus. Cardiovascular:     Rate and Rhythm: Normal rate and regular rhythm.  Pulmonary:     Effort: Pulmonary effort is normal.     Breath sounds: Normal breath sounds.  Abdominal:     General: Bowel sounds are normal. There is no distension.     Palpations: Abdomen is soft.  Musculoskeletal:     Right lower leg: No edema.     Left lower leg: No edema.  Skin:    General: Skin is warm and dry.  Capillary Refill: Capillary refill takes less than 2 seconds.  Neurological:     Mental Status: She is alert and oriented to person, place, and time.     Comments: Very hard of hearing      Labs reviewed: Recent Labs    12/29/23 0000 03/15/24 0000  NA 141 142  K 4.4 4.1  CL 108 109*  CO2 25* 26*  BUN 24* 20  CREATININE 0.9 0.7  CALCIUM  10.2 10.1   Recent Labs    12/29/23 0000  AST 15  ALT 9  ALKPHOS 59  ALBUMIN 3.9   Recent Labs    12/29/23 0000 01/05/24 0000  WBC 9.8 7.8  NEUTROABS 5,949.00  --   HGB 11.4* 10.8*  HCT 36 35*  PLT 241 266    Lab Results  Component Value Date   CHOL 122 02/16/2024   HDL 48 02/16/2024   LDLCALC 58 02/16/2024   TRIG 80 02/16/2024      Assessment/Plan Essential hypertension Continue lisinopril  40 mg daily  Depression with anxiety Continue Celexa  10 mg daily  CKD (chronic kidney disease), stage IIIa Last BUN and creatinine in October 2025 was 20 and 0.7 respectively    Mixed Alzheimer's and vascular dementia (HCC) Stable.  She is not on any dementia meds.  Overactive bladder Patient was started on Gemtesa 75 mg daily recently.  Mixed conductive and sensorineural hearing loss of both ears Patient needs to wear hearing aids at all times.  Recurrent major depression in partial remission Patient remains on Celexa  10 mg/day.  Mixed hyperlipidemia Patient mains on pravastatin  20 mg daily  Vitamin B12 deficiency Continue on 1000 mcg of B12.    Camellia Door, DO  [facility] [phone]      [1]  Allergies Allergen Reactions   Sulfamethoxazole-Trimethoprim Other (See Comments)    Hallucinations, upset stomach   Augmentin [Amoxicillin-Pot Clavulanate] Nausea Only, Rash and Other (See Comments)    GI upset GI upset Has patient had a PCN reaction causing immediate rash, facial/tongue/throat swelling, SOB or lightheadedness with hypotension:No Has patient had a PCN reaction causing severe rash involving mucus membranes or skin necrosis:No Has patient had a PCN reaction that required hospitalization:No Has patient had a PCN reaction occurring within the last 10 years:Yes--upset stomach If all of the above answers are NO, then may proceed with Cephalosporin use.    Oxycodone  Other (See Comments)    Memory loss, medication is too strong.

## 2024-06-04 NOTE — Assessment & Plan Note (Addendum)
 Last BUN and creatinine in October 2025 was 20 and 0.7 respectively

## 2024-06-04 NOTE — Assessment & Plan Note (Signed)
Continue lisinopril 40 mg daily 

## 2024-07-04 ENCOUNTER — Encounter: Payer: Self-pay | Admitting: Orthopedic Surgery

## 2024-07-04 ENCOUNTER — Non-Acute Institutional Stay (SKILLED_NURSING_FACILITY): Payer: Self-pay | Admitting: Orthopedic Surgery

## 2024-07-04 DIAGNOSIS — F339 Major depressive disorder, recurrent, unspecified: Secondary | ICD-10-CM

## 2024-07-04 DIAGNOSIS — N3281 Overactive bladder: Secondary | ICD-10-CM | POA: Diagnosis not present

## 2024-07-04 DIAGNOSIS — I10 Essential (primary) hypertension: Secondary | ICD-10-CM

## 2024-07-04 DIAGNOSIS — Z5181 Encounter for therapeutic drug level monitoring: Secondary | ICD-10-CM

## 2024-07-04 DIAGNOSIS — N1831 Chronic kidney disease, stage 3a: Secondary | ICD-10-CM

## 2024-07-04 DIAGNOSIS — G309 Alzheimer's disease, unspecified: Secondary | ICD-10-CM

## 2024-07-04 DIAGNOSIS — E538 Deficiency of other specified B group vitamins: Secondary | ICD-10-CM | POA: Diagnosis not present

## 2024-07-04 DIAGNOSIS — G4733 Obstructive sleep apnea (adult) (pediatric): Secondary | ICD-10-CM

## 2024-07-04 DIAGNOSIS — F0150 Vascular dementia without behavioral disturbance: Secondary | ICD-10-CM | POA: Diagnosis not present

## 2024-07-04 DIAGNOSIS — E782 Mixed hyperlipidemia: Secondary | ICD-10-CM

## 2024-07-04 DIAGNOSIS — F0280 Dementia in other diseases classified elsewhere without behavioral disturbance: Secondary | ICD-10-CM | POA: Diagnosis not present

## 2024-07-04 DIAGNOSIS — H906 Mixed conductive and sensorineural hearing loss, bilateral: Secondary | ICD-10-CM | POA: Diagnosis not present

## 2024-07-04 NOTE — Progress Notes (Signed)
 " Location:  Other [facility].  Nursing Home Room Number: [facility] [id number] Place of Service:  SNF [phone]) Provider:  Greig Cluster, NP  PCP: Laurence Locus, DO  Patient Care Team: Laurence Locus, DO as PCP - General (Internal Medicine)  Extended Emergency Contact Information Primary Emergency Contact: [facility] Address: [address]            [place], [place] [address] [place] Phone: [phone] Relation: Son Secondary Emergency Contact: Burnetta (POA),Geraldine Address: [address]          [place], [place] [address] [place] Mobile Phone: [phone] Relation: Daughter  Code Status:  DNR Goals of care: Advanced Directive information    05/15/2024    3:30 PM  Advanced Directives  Does Patient Have a Medical Advance Directive? Yes  Type of Advance Directive Out of facility DNR (pink MOST or yellow form)  Does patient want to make changes to medical advance directive? No - Patient declined     Chief Complaint  Patient presents with   Medical Management of Chronic Issues    Medical Management of Chronic Issues.     HPI:  Pt is a 89 y.o. female seen today for medical management of chronic diseases.    She currently resides on the skilled nursing unit at [facility]. PMH: HTN, HLD, AD, OSA, benign neoplasm of colon, lumbar radiculopathy, h/o hip replacement, BPPV, trigeminal neuralgia, osteopenia depression and anxiety.    HTN- BUN/creat 20/0.7 03/15/2024, remains on lisinopril  HLD- remains on pravastatin  CKD- GFR  74 (10/02)> was 61 (07/17) AD - no recent MMSE, BIMS 9/15 (01/16)> was 6/15 (10/16), 12/2022 CT head noted mild chronic small vessel ischemic changes, no behaviors, ambulates with walker or w/c, not on medication OAB- remains on Myrbetriq  Depression- Na+ 142 03/15/2024, remains on celexa  OSA- does not use CPAP Hearing loss- R>L, wears hearing aids B12 deficiency- Vitamin B12 level 713 03/15/2024, remains on cyanocobalamin     12/08 + flu, resolved with tamiflu . No rebound symptoms. No concerns today per patient or nurse. Very pleasant. Attends activities offered often. Son main support system. Ambulates with walker, no recent falls.   Recent weights:  01/07- 166 lbs  12/03- 162.8 lbs  11/05- 168.8 lbs  Recent blood pressures:  01/15- 135/66  01/14- 137/84  01/12- 147/67  Past Medical History:  Diagnosis Date   Anxiety    Arthritis    B12 deficiency    Baker's cyst    Rt leg   Elevated lipids    Environmental and seasonal allergies    GERD (gastroesophageal reflux disease)    High cholesterol    Hypertension    Neuropathy    Status post total replacement of hip 10/27/2016   Past Surgical History:  Procedure Laterality Date   ABDOMINAL HYSTERECTOMY     CATARACT EXTRACTION W/ INTRAOCULAR LENS IMPLANT Bilateral    CHOLECYSTECTOMY     HERNIA REPAIR     TOTAL HIP ARTHROPLASTY Left 10/27/2016   Procedure: TOTAL HIP ARTHROPLASTY;  Surgeon: Mardee Lynwood SQUIBB, MD;  Location: [facility] ORS;  Service: Orthopedics;  Laterality: Left;    Allergies[1]  Outpatient Encounter Medications as of 07/04/2024  Medication Sig   acetaminophen  (TYLENOL ) 500 MG tablet Take 1,000 mg by mouth every 8 (eight) hours as needed (for pain.). Gel cap   aspirin EC 81 MG tablet Take 81 mg by mouth daily. Every Tuesday and Thursday.   brinzolamide (AZOPT) 1 % ophthalmic suspension Place 1 drop into both  eyes 2 (two) times daily.   citalopram  (CELEXA ) 10 MG tablet Take 10 mg by mouth daily.   cyanocobalamin  (VITAMIN B12) 1000 MCG tablet Take 1,000 mcg by mouth daily.   lisinopril  (ZESTRIL ) 40 MG tablet Take 40 mg by mouth daily.   Multiple Vitamins-Minerals (PRESERVISION AREDS PO) Take 1 tablet by mouth 2 (two) times daily.   pravastatin  (PRAVACHOL ) 20 MG tablet Take 20 mg by mouth at bedtime.   timolol  (BETIMOL ) 0.25 % ophthalmic solution Place 1-2 drops into both eyes 2 (two) times daily.   Vibegron (GEMTESA) 75 MG TABS Take 1  tablet by mouth daily.   dextromethorphan-guaiFENesin (MUCINEX DM) 30-600 MG 12hr tablet Take 1 tablet by mouth 2 (two) times daily. (Patient not taking: Reported on 07/04/2024)   diclofenac (VOLTAREN) 50 MG EC tablet Take 50 mg by mouth daily. As needed for arthritis pain (Patient not taking: Reported on 07/04/2024)   guaiFENesin (ROBITUSSIN) 100 MG/5ML liquid Take 10 mLs by mouth every 4 (four) hours as needed for cough or to loosen phlegm. (Patient not taking: Reported on 07/04/2024)   No facility-administered encounter medications on file as of 07/04/2024.    Review of Systems  Constitutional:  Negative for fatigue and fever.  HENT:  Positive for hearing loss. Negative for sore throat and trouble swallowing.   Eyes:  Negative for visual disturbance.  Respiratory:  Negative for cough and shortness of breath.   Cardiovascular:  Negative for chest pain and leg swelling.  Gastrointestinal:  Negative for abdominal distention and abdominal pain.  Genitourinary:  Negative for dysuria and hematuria.  Musculoskeletal:  Positive for gait problem.  Skin:  Negative for wound.  Neurological:  Positive for weakness. Negative for dizziness and headaches.  Psychiatric/Behavioral:  Negative for confusion, dysphoric mood and sleep disturbance. The patient is not nervous/anxious.     Immunization History  Administered Date(s) Administered   Influenza Inj Mdck Quad Pf 03/29/2022   Influenza-Unspecified 04/12/2023, 03/06/2024   PFIZER Comirnaty(Gray Top)Covid-19 Tri-Sucrose Vaccine 06/23/2019, 07/24/2019   Pneumococcal Conjugate-13 03/26/2014   Pneumococcal Polysaccharide-23 03/02/2013   Pertinent  Health Maintenance Due  Topic Date Due   Influenza Vaccine  Completed   Bone Density Scan  Completed      03/24/2020    6:46 PM 03/25/2020    3:41 AM 03/25/2020    1:11 PM 04/03/2024    7:20 PM 05/21/2024    1:43 PM  Fall Risk  Falls in the past year?    1 1  Was there an injury with Fall?    0  0   Fall Risk Category Calculator    1 1  (RETIRED) Patient Fall Risk Level High fall risk  High fall risk  High fall risk     Patient at Risk for Falls Due to    History of fall(s);Impaired balance/gait History of fall(s);Impaired balance/gait  Fall risk Follow up    Falls evaluation completed Falls evaluation completed     Data saved with a previous flowsheet row definition   Functional Status Survey:    Vitals:   07/04/24 1001  BP: 135/66  Pulse: 61  Resp: 16  Temp: (!) 96.4 F (35.8 C)  SpO2: 95%  Weight: 166 lb (75.3 kg)  Height: 5' 3 (1.6 m)   Body mass index is 29.41 kg/m. Physical Exam Vitals reviewed.  Constitutional:      General: She is not in acute distress. HENT:     Head: Normocephalic.     Right Ear: Tympanic  membrane normal.     Left Ear: Tympanic membrane normal.     Ears:     Comments: Bilateral hearing aids    Nose: Nose normal.     Mouth/Throat:     Mouth: Mucous membranes are moist.     Pharynx: No posterior oropharyngeal erythema.  Eyes:     General:        Right eye: No discharge.        Left eye: No discharge.  Cardiovascular:     Rate and Rhythm: Normal rate and regular rhythm.     Pulses: Normal pulses.     Heart sounds: Normal heart sounds.  Pulmonary:     Effort: Pulmonary effort is normal. No respiratory distress.     Breath sounds: Normal breath sounds. No wheezing or rales.  Abdominal:     General: Bowel sounds are normal. There is no distension.     Palpations: Abdomen is soft.     Tenderness: There is no abdominal tenderness.  Musculoskeletal:     Cervical back: Neck supple.     Right lower leg: Edema present.     Left lower leg: Edema present.     Comments: Non pitting  Skin:    General: Skin is warm.     Capillary Refill: Capillary refill takes less than 2 seconds.  Neurological:     General: No focal deficit present.     Mental Status: She is alert. Mental status is at baseline.     Gait: Gait abnormal.  Psychiatric:         Mood and Affect: Mood normal.     Comments: Very pleasant, alert to self/person/place, follows commands     Labs reviewed: Recent Labs    12/29/23 0000 03/15/24 0000  NA 141 142  K 4.4 4.1  CL 108 109*  CO2 25* 26*  BUN 24* 20  CREATININE 0.9 0.7  CALCIUM  10.2 10.1   Recent Labs    12/29/23 0000  AST 15  ALT 9  ALKPHOS 59  ALBUMIN 3.9   Recent Labs    12/29/23 0000 01/05/24 0000  WBC 9.8 7.8  NEUTROABS 5,949.00  --   HGB 11.4* 10.8*  HCT 36 35*  PLT 241 266   No results found for: TSH No results found for: HGBA1C Lab Results  Component Value Date   CHOL 122 02/16/2024   HDL 48 02/16/2024   LDLCALC 58 02/16/2024   TRIG 80 02/16/2024    Significant Diagnostic Results in last 30 days:  No results found.  Assessment/Plan 1. Essential hypertension - controlled, goal < 150/90 - cont lisinopril   2. Mixed hyperlipidemia - LDL stable with pravastatin   3. Stage 3a chronic kidney disease (HCC) - encourage hydration with water - avoid NSAIDS  4. Mixed Alzheimer's and vascular dementia (HCC) - no agitation or anxiety - dependent with some ADLs> needs cueing  - ambulates with walker - weight stable - cont skilled nursing   5. Overactive bladder - stable with Gemtesa  6. Recurrent depression - no changes in mood - on Citalopram > no GDR since 12/2023 - will reduce Citalopram  to 5 mg  - if no changes in mood next month, discontinue next month  7. OSA (obstructive sleep apnea) - cont CPAP  8. Mixed conductive and sensorineural hearing loss of both ears - cont hearing aids  9. Vitamin B12 deficiency - cont cyanocobalamin    10. Encounter for medication titration (Primary) - will reduce citalopram  to 5 mg daily  Family/ staff Communication: plan discussed with patient and nurse  Labs/tests ordered:  none         [1]  Allergies Allergen Reactions   Sulfamethoxazole-Trimethoprim Other (See Comments)    Hallucinations, upset  stomach   Augmentin [Amoxicillin-Pot Clavulanate] Nausea Only, Rash and Other (See Comments)    GI upset GI upset Has patient had a PCN reaction causing immediate rash, facial/tongue/throat swelling, SOB or lightheadedness with hypotension:No Has patient had a PCN reaction causing severe rash involving mucus membranes or skin necrosis:No Has patient had a PCN reaction that required hospitalization:No Has patient had a PCN reaction occurring within the last 10 years:Yes--upset stomach If all of the above answers are NO, then may proceed with Cephalosporin use.    Oxycodone  Other (See Comments)    Memory loss, medication is too strong.   "
